# Patient Record
Sex: Female | Born: 1985 | Race: White | Hispanic: No | Marital: Married | Smoking: Current some day smoker
Health system: Southern US, Community
[De-identification: ages and names within clinical notes are randomized; demographics above are authoritative.]

## PROBLEM LIST (undated history)

## (undated) DIAGNOSIS — S83519A Sprain of anterior cruciate ligament of unspecified knee, initial encounter: Secondary | ICD-10-CM

## (undated) HISTORY — PX: NO PAST SURGERIES: SHX2092

---
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
## 2006-08-30 ENCOUNTER — Emergency Department (HOSPITAL_COMMUNITY): Admission: EM | Admit: 2006-08-30 | Discharge: 2006-08-30 | Payer: Self-pay | Admitting: Emergency Medicine

## 2006-09-03 ENCOUNTER — Emergency Department (HOSPITAL_COMMUNITY): Admission: EM | Admit: 2006-09-03 | Discharge: 2006-09-03 | Payer: Self-pay | Admitting: Emergency Medicine

## 2007-03-22 ENCOUNTER — Emergency Department: Payer: Self-pay | Admitting: Emergency Medicine

## 2008-04-08 IMAGING — US US ABDOMEN COMPLETE
2 series · 14 of 25 positions shown · non-contrast
Comparison: Abdominal CT 08/30/06.

CLINICAL DATA: Right sided abdominal pain. 
 ABDOMEN ULTRASOUND:
TECHNIQUE: Complete abdominal ultrasound examination was performed including evaluation of the liver, gallbladder, bile ducts, pancreas, kidneys, spleen, IVC, and abdominal aorta.

[Series 1: unknown · 0.35mm/px · 13 of 58 slices shown (1 of 2)]
[im 1/58]
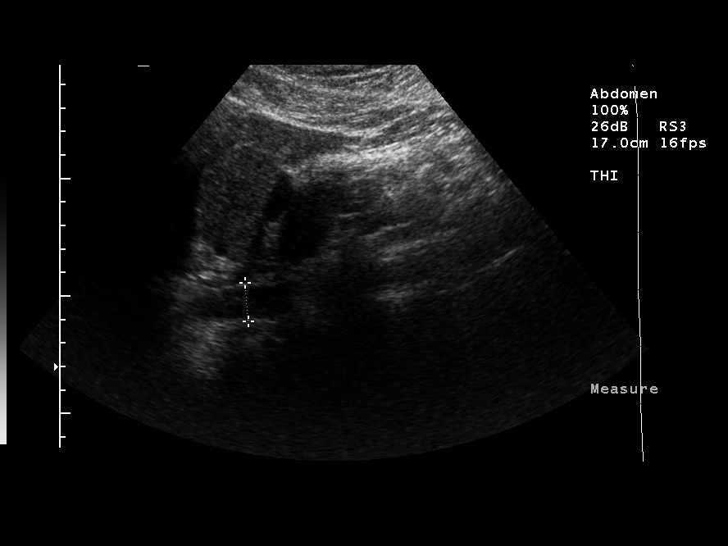
[im 5/58]
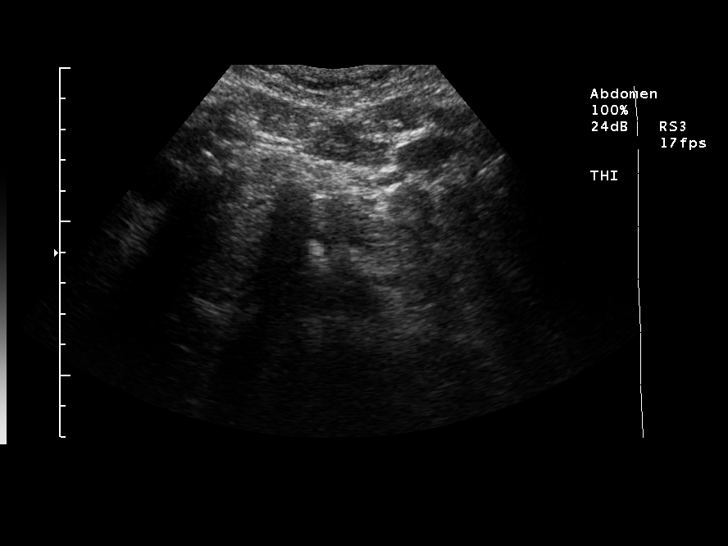
[im 10/58]
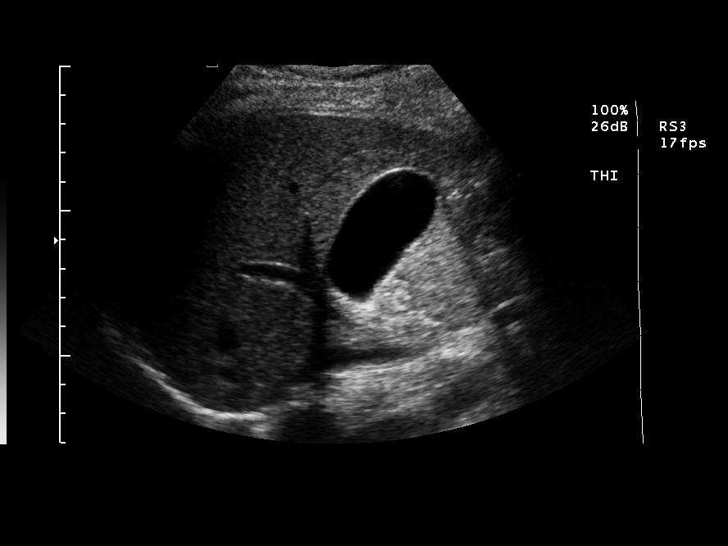
[im 15/58]
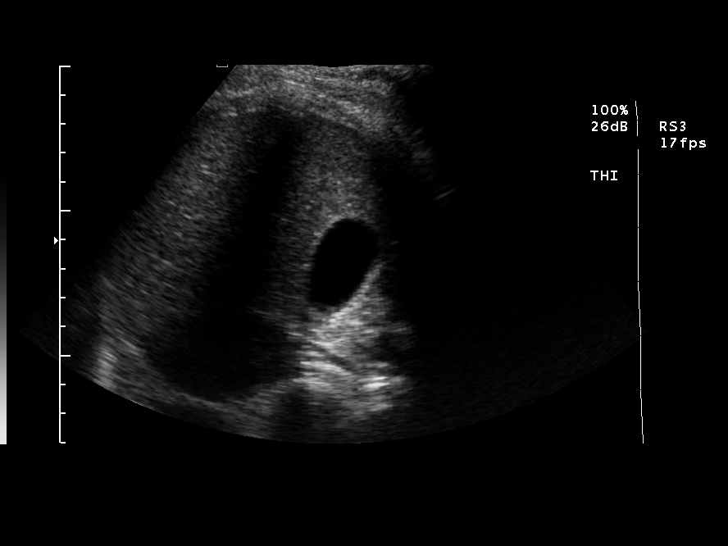
[im 20/58]
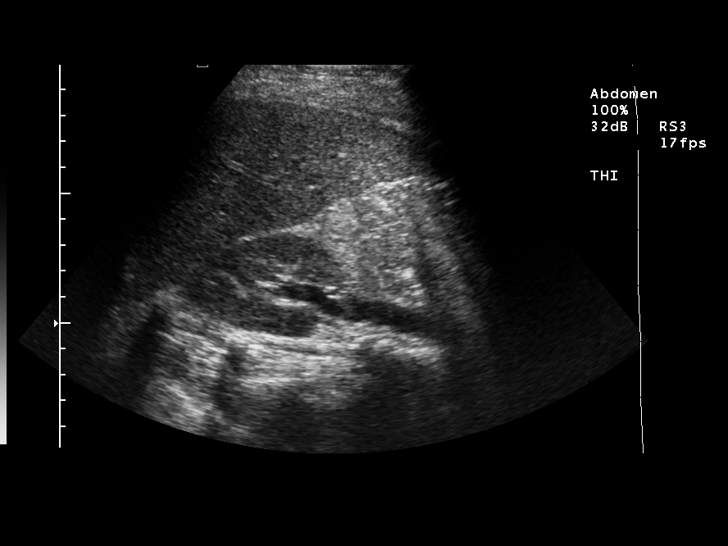
[im 23/58]
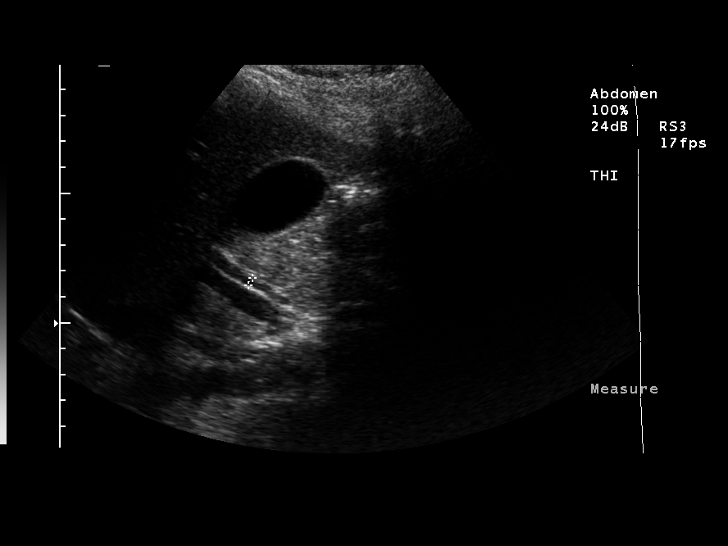
[im 28/58]
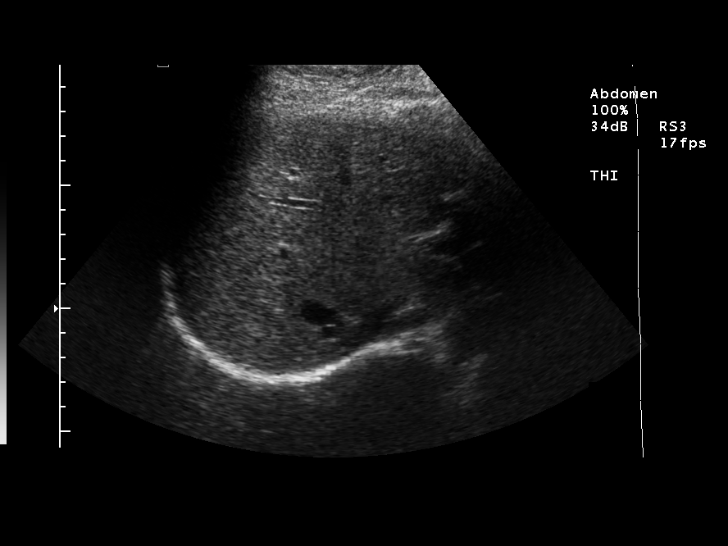
[im 33/58]
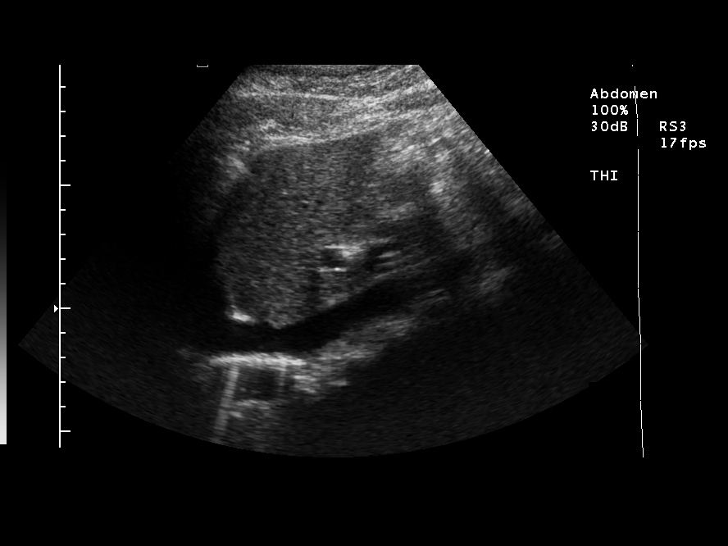
[im 38/58]
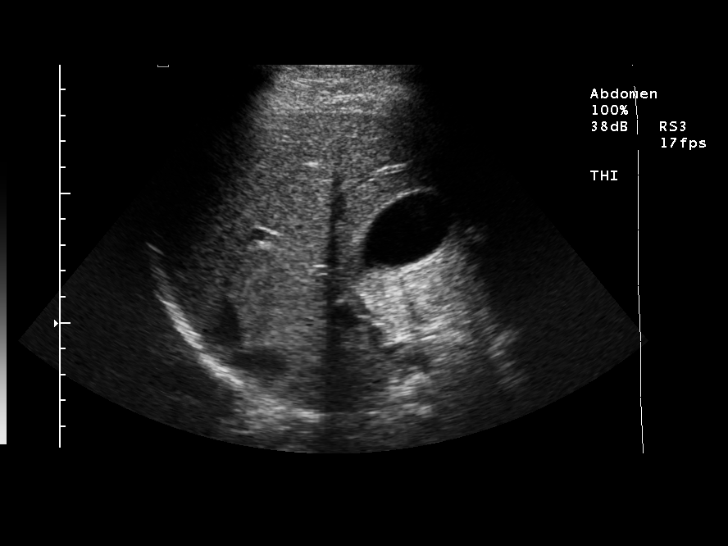
[im 40/58]
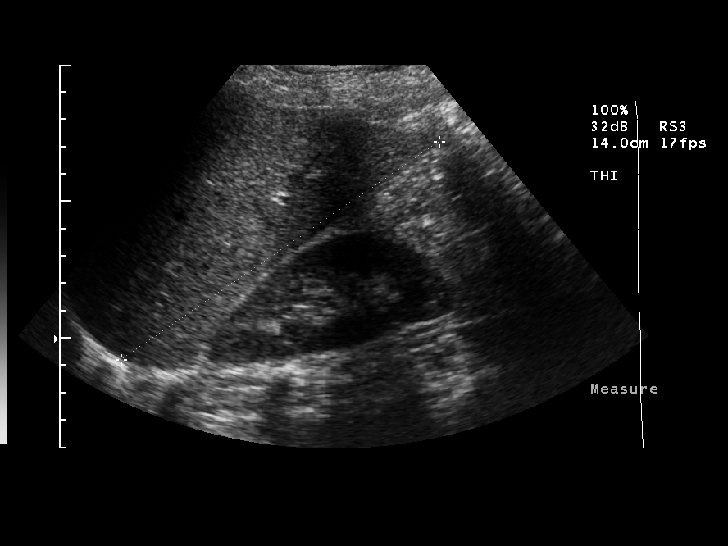
[im 45/58]
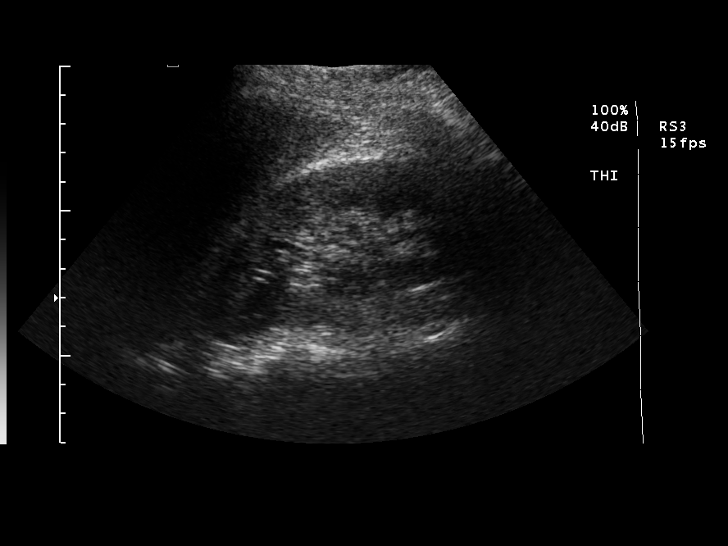
[im 50/58]
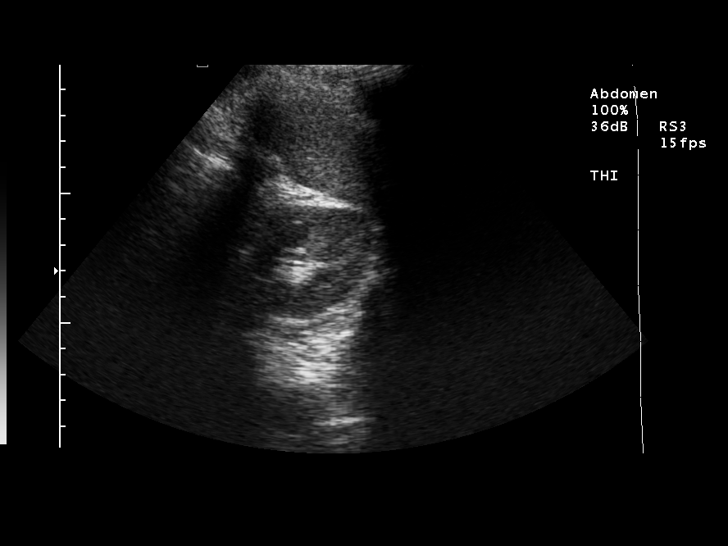
[im 55/58]
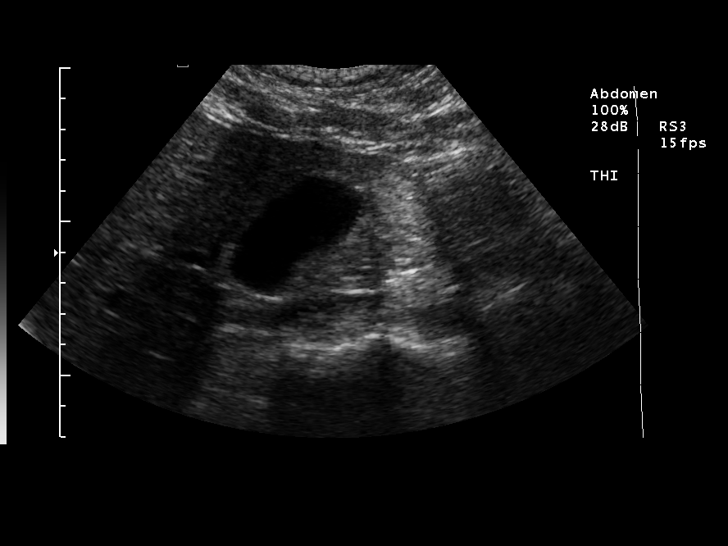

[Series 1: unknown · 0.30mm/px · 1 of 2 slices shown (2 of 2)]
[im 1/2]
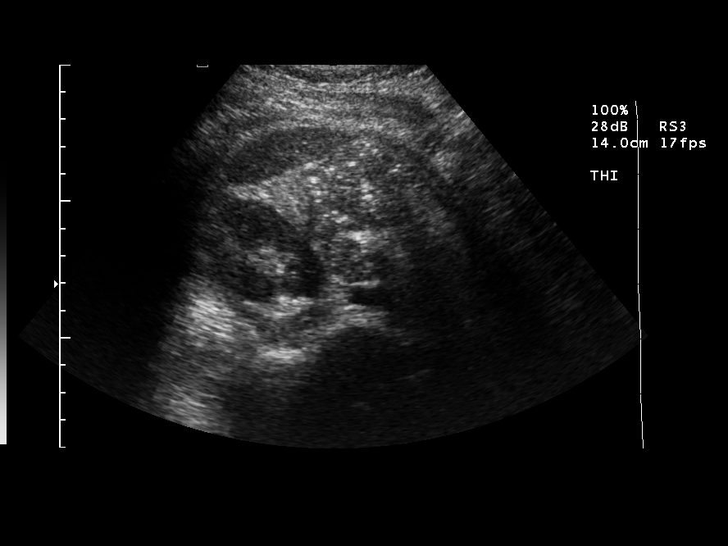

[14 of 25 positions shown; findings below may reference images not displayed]

FINDINGS: There is no evidence of gallstones or biliary ductal dilatation.  The liver is within normal limits in echogenicity, and no focal liver lesions are seen.  The visualized portions of the IVC and pancreas are unremarkable.
 There is no evidence of splenomegaly.  The kidneys are unremarkable, and there is no evidence of hydronephrosis.  Right renal length is 10.2 cm and left renal length 10.6 cm.  The abdominal aorta is non-dilated.
IMPRESSION: Negative abdominal ultrasound.

## 2010-08-03 ENCOUNTER — Emergency Department (HOSPITAL_COMMUNITY)
Admission: EM | Admit: 2010-08-03 | Discharge: 2010-08-03 | Payer: Self-pay | Source: Home / Self Care | Admitting: Emergency Medicine

## 2011-01-21 NOTE — Consult Note (Signed)
Caroline Walker                ACCOUNT NO.:  [id number]   MEDICAL RECORD NO.:  [id number]          PATIENT TYPE:  EMS   LOCATION:  [facility]                         FACILITY:  [facility]   PHYSICIAN:  Lonia Blood, M.D.DATE OF BIRTH:  Jul 28, 1986   DATE OF CONSULTATION:  DATE OF DISCHARGE:                                 CONSULTATION   ER VISIT NOTE:   PRIMARY CARE PHYSICIAN:  Unassigned.   CHIEF COMPLAINT:  Abdominal pain radiating to the right shoulder.   HISTORY OF PRESENT ILLNESS:  [name] is an otherwise healthy 53-  year-old female who was visiting with a friend this morning when she  began to develop severe bilateral upper quadrant abdominal pain of a  sharp, stabbing nature.  The pain has been constant since its onset.  It  has radiated up into the right shoulder region and also down into  bilateral lower abdominal quadrants.  There has been some mild  constipation reported but no diarrhea, no melena, and no hematochezia.  The patient has not had similar symptoms in the past.  There has been no  nausea or vomiting, although the patient does not have an appetite this  morning.  There has been no fevers or chills.  The patient has no other  complaints.  The patient was in her usual state of health yesterday.  There has been no abdominal trauma.  The patient has just completed her  period with her last day of bleeding approximately two days ago.  It was  of normal length.  She does report severe abdominal cramping with her  bleeding and a usual six days of heavy bleeding but states that this  period was not unusual for her.  She has not had dysuria or polyuria.  There have been no new recent sexual encounters.   REVIEW OF SYSTEMS:  Comprehensive review of systems is unremarkable with  the exception of multiple positive elements noted in the history of  present illness above.   PAST MEDICAL HISTORY:  None.   MEDICATIONS:  None.   ALLERGIES:  No known drug  allergies.   FAMILY HISTORY:  Patient's mother is alive and healthy.  The patient's  father's medical history is not known.  There is no extended family  history of cervical cancer, ovarian cancer, breast cancer, or colon  cancer, per the family's report.   SOCIAL HISTORY:  Patient lives in [place].  She does smoke.  She  occasionally partakes of alcohol but never in excess.  She is presently  in school at the college level.  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   DATA REVIEWED:  White count is mildly elevated at 10.6 with a normal  hemoglobin and platelet count.  Sodium, potassium, chloride,  bicarb,  BUN, creatinine, calcium, serum glucose are all normal.  LFTs are  normal.  Total bili is normal.  Albumin is normal at 3.6.   KUB reveals evidence of mild constipation, but there is no acute  abnormality appreciated.   Lipase is normal.  Urinalysis reveals moderate leukocyte esterase and  21-  50 white blood cells.  Urine pregnancy test is negative.   CT scan of the abdomen is obtained, which reveals incomplete rotation of  the bowel with most of the small bowel in the right abdomen and most of  the colon on the left.  There are no signs of vascular compromise or  bowel obstruction.  There are no acute abdominal or pelvic findings, and  the appendix is noted to be normal.   PHYSICAL EXAMINATION:  VITAL SIGNS:  Temperature 98.7, blood pressure  117/71, heart rate 66, respiratory rate 18, O2 sat 99% on room air.  GENERAL:  A well-developed and well-nourished 25 year old female in no  acute respiratory distress.  HEENT:  Normocephalic and atraumatic.  Pupils are equal, round and  reactive to light and accommodation.  Extraocular muscles are intact  bilaterally.  OC/OP clear.  NECK:  No JVD, no lymphadenopathy.  LUNGS:  Clear to auscultation bilaterally without wheezes or rhonchi.  CARDIOVASCULAR:  Regular rate and rhythm without murmur, rub or gallop  with normal S1 and  S2.  ABDOMEN:  Thin, soft, nondistended.  Bowel sounds are positive.  The  patient is tender to deep palpation throughout but much more so in the  right lower quadrant and the right upper quadrant.  There is no rebound.  There is no appreciable mass.  There is no evident ascites.  EXTREMITIES:  No significant clubbing, cyanosis or edema in the  bilateral lower extremities.  NEUROLOGIC:  The neurologic exam is nonfocal.   IMPRESSION/PLAN:  1. Pyelonephritis:  A new diagnosis of pyelonephritis has been made.      There is no evidence of a perinephric abscess.  We will treat the      patient empirically with Cipro 500 mg p.o. b.i.d. x10 days total.      Urine will be sent for culture, and the patient will be contacted      if her urine culture reveals a drug-resistant strain.  2. Severe abdominal pain:  The extent of the patient's abdominal pain      was worrisome.  This prompted a CT scan of the abdomen to rule out      the possibility of appendicitis.  Fortunately, the appendix was      able to be visualized and was noted to be normal on CT scan.      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  While an unexpected      finding, there is no evidence of acute complications related to      this.  I have informed the patient of this finding.  I have      explained to her that this may cause frequent constipation and have      advised her to use a fibre additive in her diet.  I have advised      her that if the pain is related to her pyelonephritis, it should      improve significantly within the next 24-48 hours.  I have advised      her that should her pain continue or should it worsen, she should      report immediately back to the emergency room for re-evaluation.      At this time, I am confident ruling out pancreatitis, appendicitis,      and cholecystitis.  Should the patient's pain not improve, we may      need to consider the possibility of a full GYN evaluation. 3. Tobacco abuse:  Patient was advised that she should discontinue      smoking completely immediately.  She was advised of the multiple      deleterious effects of ongoing tobacco abuse.      Lonia Blood, M.D.  Electronically Signed     JTM/MEDQ  D:  08/30/2006  T:  08/30/2006  Job:  [id number]

## 2014-12-15 ENCOUNTER — Encounter (HOSPITAL_COMMUNITY): Payer: Self-pay

## 2014-12-15 ENCOUNTER — Emergency Department (HOSPITAL_COMMUNITY)
Admission: EM | Admit: 2014-12-15 | Discharge: 2014-12-15 | Disposition: A | Discharge status: Home / Self Care | Payer: Self-pay | Attending: Emergency Medicine | Admitting: Emergency Medicine

## 2014-12-15 DIAGNOSIS — H6091 Unspecified otitis externa, right ear: Secondary | ICD-10-CM | POA: Insufficient documentation

## 2014-12-15 DIAGNOSIS — Z720 Tobacco use: Secondary | ICD-10-CM | POA: Insufficient documentation

## 2014-12-15 DIAGNOSIS — J029 Acute pharyngitis, unspecified: Secondary | ICD-10-CM | POA: Insufficient documentation

## 2014-12-15 LAB — RAPID STREP SCREEN (MED CTR MEBANE ONLY): Streptococcus, Group A Screen (Direct): NEGATIVE

## 2014-12-15 MED ORDER — NEOMYCIN-POLYMYXIN-HC 1 % OT SOLN
4.0000 [drp] | OTIC | Status: AC
  Administered 2014-12-15: 4 [drp] via OTIC
  Filled 2014-12-15: qty 10

## 2014-12-15 MED ORDER — HYDROCODONE-ACETAMINOPHEN 5-325 MG PO TABS
2.0000 | ORAL_TABLET | Freq: Once | ORAL | Status: AC
  Administered 2014-12-15: 2 via ORAL
  Filled 2014-12-15: qty 2

## 2014-12-15 MED ORDER — HYDROCODONE-ACETAMINOPHEN 5-325 MG PO TABS
2.0000 | ORAL_TABLET | ORAL | Status: DC | PRN
Start: 2014-12-15 — End: 2021-09-12

## 2014-12-15 MED ORDER — NAPROXEN 500 MG PO TABS: 500.0000 mg | ORAL_TABLET | Freq: Two times a day (BID) | ORAL | Status: DC

## 2014-12-15 MED ORDER — ANTIPYRINE-BENZOCAINE 5.4-1.4 % OT SOLN: 3.0000 [drp] | OTIC | Status: DC | PRN

## 2014-12-15 NOTE — ED Notes (Signed)
Pt states ear pain has been going on for the past 3-4 four hours, denies trauma

## 2014-12-15 NOTE — ED Provider Notes (Signed)
CSN: [id number]     Arrival date & time 12/15/14  0533 History   First MD Initiated Contact with Patient 12/15/14 0546     Chief Complaint  Patient presents with  . Otalgia     (Consider location/radiation/quality/duration/timing/severity/associated sxs/prior Treatment) HPI Comments: The patient is a 29 year old female, she presents with right ear pain which started 3 hours ago, seems to be worse with pulling on the ear, palpation of the ear and is associated with a sore throat. She reports recently being ill. Symptoms are persistent, gradually worsening, not associated with fevers.  The history is provided by the patient.    History reviewed. No pertinent past medical history. History reviewed. No pertinent past surgical history. No family history on file. History  Substance Use Topics  . Smoking status: Current Every Day Smoker  . Smokeless tobacco: Not on file  . Alcohol Use: No   OB History    No data available     Review of Systems  Constitutional: Negative for fever.  HENT: Positive for ear pain and sore throat. Negative for ear discharge.       Allergies  Bee venom and Penicillins  Home Medications   Prior to Admission medications   Medication Sig Start Date End Date Taking? Authorizing Provider  antipyrine-benzocaine Lyla Son(AURALGAN) otic solution Place 3-4 drops into the right ear every 2 (two) hours as needed for ear pain. 12/15/14   Eber HongBrian Shontavia Mickel, MD  HYDROcodone-acetaminophen (NORCO/VICODIN) 5-325 MG per tablet Take 2 tablets by mouth every 4 (four) hours as needed. 12/15/14   Eber HongBrian Bran Aldridge, MD  naproxen (NAPROSYN) 500 MG tablet Take 1 tablet (500 mg total) by mouth 2 (two) times daily with a meal. 12/15/14   Eber HongBrian Starlette Thurow, MD   BP 127/71 mmHg  Pulse 75  Resp 18  Ht 5\' 5"  (1.651 m)  Wt 145 lb (65.772 kg)  BMI 24.13 kg/m2  SpO2 100%  LMP 11/27/2014 Physical Exam  Constitutional: She appears well-developed and well-nourished.  HENT:  Head: Normocephalic and  atraumatic.  Pain with manipulation of the auricle and the tragus on the right, normal appearing left external ear, right external auditory canal with erythema and some desquamation, normal-appearing tympanic membrane, clear effusion behind the membrane, no purulence or erythema, normal-appearing tympanic membrane on the left, oropharynx with tonsillar hypertrophy and mild erythema, no exudate asymmetry or uvular deviation, mucous members moist and phonation normal  Eyes: Conjunctivae are normal. Right eye exhibits no discharge. Left eye exhibits no discharge.  Pulmonary/Chest: Effort normal. No respiratory distress.  Lymphadenopathy:    She has cervical adenopathy (cervical adenopathy present in the left anterior cervical chain, no stiffness to the neck).  Neurological: She is alert. Coordination normal.  Skin: Skin is warm and dry. No rash noted. She is not diaphoretic. No erythema.  Psychiatric: She has a normal mood and affect.  Nursing note and vitals reviewed.   ED Course  Procedures (including critical care time) Labs Review Labs Reviewed  RAPID STREP SCREEN  CULTURE, GROUP A STREP    Imaging Review No results found.   EKG Interpretation None      MDM   Final diagnoses:  Otitis externa, right  Pharyngitis    Likely has otitis externa, possibly strep pharyngitis, vital signs normal, patient nontoxic  Strep neg  Meds given in ED:  Medications  NEOMYCIN-POLYMYXIN-HYDROCORTISONE (CORTISPORIN) otic solution 4 drop (not administered)  HYDROcodone-acetaminophen (NORCO/VICODIN) 5-325 MG per tablet 2 tablet (2 tablets Oral Given 12/15/14 [id number])    New  Prescriptions   ANTIPYRINE-BENZOCAINE (AURALGAN) OTIC SOLUTION    Place 3-4 drops into the right ear every 2 (two) hours as needed for ear pain.   HYDROCODONE-ACETAMINOPHEN (NORCO/VICODIN) 5-325 MG PER TABLET    Take 2 tablets by mouth every 4 (four) hours as needed.   NAPROXEN (NAPROSYN) 500 MG TABLET    Take 1 tablet (500  mg total) by mouth 2 (two) times daily with a meal.        Eber Hong, MD 12/15/14 [phone]

## 2014-12-15 NOTE — Discharge Instructions (Signed)
Please call your doctor for a followup appointment within 24-48 hours. When you talk to your doctor please let them know that you were seen in the emergency department and have them acquire all of your records so that they can discuss the findings with you and formulate a treatment plan to fully care for your new and ongoing problems.   Use the ear drops - 4 drops every 4 hours when awake - this should improve over next 2 days - hydrocodone and naprosyn for severe pain

## 2014-12-17 LAB — CULTURE, GROUP A STREP

## 2018-03-15 ENCOUNTER — Ambulatory Visit: Payer: Self-pay | Admitting: Family Medicine

## 2018-03-15 VITALS — BP 110/60 | HR 71 | Resp 18 | Wt 149.4 lb

## 2018-03-15 DIAGNOSIS — L089 Local infection of the skin and subcutaneous tissue, unspecified: Secondary | ICD-10-CM

## 2018-03-15 MED ORDER — DOXYCYCLINE HYCLATE 100 MG PO CAPS: 100.0000 mg | ORAL_CAPSULE | Freq: Two times a day (BID) | ORAL | 0 refills | Status: DC

## 2018-03-15 NOTE — Patient Instructions (Addendum)
Take Doxycyline as prescribed. I have attached information which provides you with signs and symptoms indicating worsening infection. Continue to apply antibiotic ointment to wound.   Tdap Vaccine (Tetanus, Diphtheria and Pertussis): What You Need to Know 1. Why get vaccinated? Tetanus, diphtheria and pertussis are very serious diseases. Tdap vaccine can protect [place] from these diseases. And, Tdap vaccine given to pregnant women can protect newborn babies against pertussis. TETANUS (Lockjaw) is rare in the [place] today. It causes painful muscle tightening and stiffness, usually all over the body.  It can lead to tightening of muscles in the head and neck so you can't open your mouth, swallow, or sometimes even breathe. Tetanus kills about 1 out of 10 people who are infected even after receiving the best medical care.  DIPHTHERIA is also rare in the [place] today. It can cause a thick coating to form in the back of the throat.  It can lead to breathing problems, heart failure, paralysis, and death.  PERTUSSIS (Whooping Cough) causes severe coughing spells, which can cause difficulty breathing, vomiting and disturbed sleep.  It can also lead to weight loss, incontinence, and rib fractures. Up to 2 in 100 adolescents and 5 in 100 adults with pertussis are hospitalized or have complications, which could include pneumonia or death.  These diseases are caused by bacteria. Diphtheria and pertussis are spread from person to person through secretions from coughing or sneezing. Tetanus enters the body through cuts, scratches, or wounds. Before vaccines, as many as 200,000 cases of diphtheria, 200,000 cases of pertussis, and hundreds of cases of tetanus, were reported in the [place] each year. Since vaccination began, reports of cases for tetanus and diphtheria have dropped by about 99% and for pertussis by about 80%. 2. Tdap vaccine Tdap vaccine can protect adolescents and adults from  tetanus, diphtheria, and pertussis. One dose of Tdap is routinely given at age 70 or 51. People who did not get Tdap at that age should get it as soon as possible. Tdap is especially important for healthcare professionals and anyone having close contact with a baby younger than 12 months. Pregnant women should get a dose of Tdap during every pregnancy, to protect the newborn from pertussis. Infants are most at risk for severe, life-threatening complications from pertussis. Another vaccine, called Td, protects against tetanus and diphtheria, but not pertussis. A Td booster should be given every 10 years. Tdap may be given as one of these boosters if you have never gotten Tdap before. Tdap may also be given after a severe cut or burn to prevent tetanus infection. Your doctor or the person giving you the vaccine can give you more information. Tdap may safely be given at the same time as other vaccines. 3. Some people should not get this vaccine  A person who has ever had a life-threatening allergic reaction after a previous dose of any diphtheria, tetanus or pertussis containing vaccine, OR has a severe allergy to any part of this vaccine, should not get Tdap vaccine. Tell the person giving the vaccine about any severe allergies.  Anyone who had coma or long repeated seizures within 7 days after a childhood dose of DTP or DTaP, or a previous dose of Tdap, should not get Tdap, unless a cause other than the vaccine was found. They can still get Td.  Talk to your doctor if you: ? have seizures or another nervous system problem, ? had severe pain or swelling after any vaccine containing diphtheria, tetanus or  pertussis, ? ever had a condition called Guillain-Barr Syndrome (GBS), ? aren't feeling well on the day the shot is scheduled. 4. Risks With any medicine, including vaccines, there is a chance of side effects. These are usually mild and go away on their own. Serious reactions are also possible but  are rare. Most people who get Tdap vaccine do not have any problems with it. Mild problems following Tdap: (Did not interfere with activities)  Pain where the shot was given (about 3 in 4 adolescents or 2 in 3 adults)  Redness or swelling where the shot was given (about 1 person in 5)  Mild fever of at least 100.4F (up to about 1 in 25 adolescents or 1 in 100 adults)  Headache (about 3 or 4 people in 10)  Tiredness (about 1 person in 3 or 4)  Nausea, vomiting, diarrhea, stomach ache (up to 1 in 4 adolescents or 1 in 10 adults)  Chills, sore joints (about 1 person in 10)  Body aches (about 1 person in 3 or 4)  Rash, swollen glands (uncommon)  Moderate problems following Tdap: (Interfered with activities, but did not require medical attention)  Pain where the shot was given (up to 1 in 5 or 6)  Redness or swelling where the shot was given (up to about 1 in 16 adolescents or 1 in 12 adults)  Fever over 102F (about 1 in 100 adolescents or 1 in 250 adults)  Headache (about 1 in 7 adolescents or 1 in 10 adults)  Nausea, vomiting, diarrhea, stomach ache (up to 1 or 3 people in 100)  Swelling of the entire arm where the shot was given (up to about 1 in 500).  Severe problems following Tdap: (Unable to perform usual activities; required medical attention)  Swelling, severe pain, bleeding and redness in the arm where the shot was given (rare).  Problems that could happen after any vaccine:  People sometimes faint after a medical procedure, including vaccination. Sitting or lying down for about 15 minutes can help prevent fainting, and injuries caused by a fall. Tell your doctor if you feel dizzy, or have vision changes or ringing in the ears.  Some people get severe pain in the shoulder and have difficulty moving the arm where a shot was given. This happens very rarely.  Any medication can cause a severe allergic reaction. Such reactions from a vaccine are very rare,  estimated at fewer than 1 in a million doses, and would happen within a few minutes to a few hours after the vaccination. As with any medicine, there is a very remote chance of a vaccine causing a serious injury or death. The safety of vaccines is always being monitored. For more information, visit: http://floyd.org/www.cdc.gov/vaccinesafety/ 5. What if there is a serious problem? What should I look for? Look for anything that concerns you, such as signs of a severe allergic reaction, very high fever, or unusual behavior. Signs of a severe allergic reaction can include hives, swelling of the face and throat, difficulty breathing, a fast heartbeat, dizziness, and weakness. These would usually start a few minutes to a few hours after the vaccination. What should I do?  If you think it is a severe allergic reaction or other emergency that can't wait, call 9-1-1 or get the person to the nearest hospital. Otherwise, call your doctor.  Afterward, the reaction should be reported to the Vaccine Adverse Event Reporting System (VAERS). Your doctor might file this report, or you can do it yourself  through the VAERS web site at www.vaers.LAgents.no, or by calling 1-2344654208. ? VAERS does not give medical advice. 6. The National Vaccine Injury Compensation Program The [employer] Vaccine Injury Compensation Program (VICP) is a federal program that was created to compensate people who may have been injured by certain vaccines. Persons who believe they may have been injured by a vaccine can learn about the program and about filing a claim by calling 1-208-002-6431 or visiting the VICP website at SpiritualWord.at. There is a time limit to file a claim for compensation. 7. How can I learn more?  Ask your doctor. He or she can give you the vaccine package insert or suggest other sources of information.  Call your local or state health department.  Contact the Centers for Disease Control and Prevention  (CDC): ? Call 219-061-8280 (1-800-CDC-INFO) or ? Visit CDC's website at PicCapture.uy CDC Tdap Vaccine VIS (10/29/13) This information is not intended to replace advice given to you by your health care provider. Make sure you discuss any questions you have with your health care provider. Document Released: 02/21/2012 Document Revised: 05/12/2016 Document Reviewed: 05/12/2016 Elsevier Interactive Patient Education  2017 [employer] Inc.  Cellulitis, Adult Cellulitis is a skin infection. The infected area is usually red and sore. This condition occurs most often in the arms and lower legs. It is very important to get treated for this condition. Follow these instructions at home:  Take over-the-counter and prescription medicines only as told by your doctor.  If you were prescribed an antibiotic medicine, take it as told by your doctor. Do not stop taking the antibiotic even if you start to feel better.  Drink enough fluid to keep your pee (urine) clear or pale yellow.  Do not touch or rub the infected area.  Raise (elevate) the infected area above the level of your heart while you are sitting or lying down.  Place warm or cold wet cloths (warm or cold compresses) on the infected area. Do this as told by your doctor.  Keep all follow-up visits as told by your doctor. This is important. These visits let your doctor make sure your infection is not getting worse. Contact a doctor if:  You have a fever.  Your symptoms do not get better after 1-2 days of treatment.  Your bone or joint under the infected area starts to hurt after the skin has healed.  Your infection comes back. This can happen in the same area or another area.  You have a swollen bump in the infected area.  You have new symptoms.  You feel ill and also have muscle aches and pains. Get help right away if:  Your symptoms get worse.  You feel very sleepy.  You throw up (vomit) or have watery poop (diarrhea) for  a long time.  There are red streaks coming from the infected area.  Your red area gets larger.  Your red area turns darker. This information is not intended to replace advice given to you by your health care provider. Make sure you discuss any questions you have with your health care provider. Document Released: 02/08/2008 Document Revised: 01/28/2016 Document Reviewed: 07/01/2015 Elsevier Interactive Patient Education  2018 [employer].

## 2018-03-15 NOTE — Progress Notes (Signed)
Patient ID: Caroline Walker, female    DOB: 06/26/1986, 32 y.o.   MRN: [id number]  PCP: Patient, No Pcp Per  Chief Complaint  Patient presents with  . wound/IMM    Subjective:  HPI Caroline Walker is a 32 y.o. female presents for evaluation of wound infection. Patient reports running over 1 week ago and she tripped falling onto a piece of wound stump lying the yard. A piece of the wood from the stump entered the patient's leg. She removed the wood splinter and has applied peroxide, topical antibiotic, and tea tree oil. The wound remains painful and tender to touch. She denies red streaks , fever, chills, or swelling of the surrounding skin. Social History   Socioeconomic History  . Marital status: Married    Spouse name: Not on file  . Number of children: Not on file  . Years of education: Not on file  . Highest education level: Not on file  Occupational History  . Not on file  Social Needs  . Financial resource strain: Not on file  . Food insecurity:    Worry: Not on file    Inability: Not on file  . Transportation needs:    Medical: Not on file    Non-medical: Not on file  Tobacco Use  . Smoking status: Current Every Day Smoker  Substance and Sexual Activity  . Alcohol use: No  . Drug use: Not on file  . Sexual activity: Not on file  Lifestyle  . Physical activity:    Days per week: Not on file    Minutes per session: Not on file  . Stress: Not on file  Relationships  . Social connections:    Talks on phone: Not on file    Gets together: Not on file    Attends religious service: Not on file    Active member of club or organization: Not on file    Attends meetings of clubs or organizations: Not on file    Relationship status: Not on file  . Intimate partner violence:    Fear of current or ex partner: Not on file    Emotionally abused: Not on file    Physically abused: Not on file    Forced sexual activity: Not on file  Other Topics Concern  . Not on file  Social  History Narrative  . Not on file    Review of Systems  Pertinent negatives listed in HPI  There are no active problems to display for this patient.   Allergies  Allergen Reactions  . Bee Venom Hives  . Penicillins Hives    Prior to Admission medications   Medication Sig Start Date End Date Taking? Authorizing Provider  antipyrine-benzocaine Lyla Son(AURALGAN) otic solution Place 3-4 drops into the right ear every 2 (two) hours as needed for ear pain. 12/15/14   Eber HongMiller, Brian, MD  HYDROcodone-acetaminophen (NORCO/VICODIN) 5-325 MG per tablet Take 2 tablets by mouth every 4 (four) hours as needed. 12/15/14   Eber HongMiller, Brian, MD  naproxen (NAPROSYN) 500 MG tablet Take 1 tablet (500 mg total) by mouth 2 (two) times daily with a meal. 12/15/14   Eber HongMiller, Brian, MD    Past Medical, Surgical Family and Social History reviewed and updated.    Objective:   Today's Vitals   03/15/18 1453  BP: 110/60  Pulse: 71  Resp: 18  Weight: 149 lb 6.4 oz (67.8 kg)    Wt Readings from Last 3 Encounters:  12/15/14 145 lb (65.8 kg)  Physical Exam  Constitutional: She appears well-developed and well-nourished.  HENT:  Head: Normocephalic.  Cardiovascular: Normal rate, regular rhythm and normal heart sounds.  Pulmonary/Chest: Effort normal and breath sounds normal.  Neurological: She is alert.  Skin: Lesion noted. There is erythema.     Psychiatric: She has a normal mood and affect. Her behavior is normal. Judgment and thought content normal.   Assessment & Plan:  1. Skin infection,  Covering against possible early cellulitis with a 10 day course of Doxycyline.  Encouraged to continue topical antibiotic therapy. TDAP given.  Meds ordered this encounter  Medications  . doxycycline (VIBRAMYCIN) 100 MG capsule    Sig: Take 1 capsule (100 mg total) by mouth 2 (two) times daily.    Dispense:  20 capsule    Refill:  0   If symptoms worsen or do not improve, return for follow-up, follow-up with  PCP, or at the emergency department if severity of symptoms warrant a higher level of care.    Godfrey Pick. Tiburcio Pea, MSN, FNP-C [facility]  [address]  [place], [place] [address] [phone]

## 2018-03-15 NOTE — Progress Notes (Signed)
Pt presents here today for visit to receive Tdap vaccine. Allergies reviewed, vaccine given, vaccine information statement provided, tolerated well.   

## 2019-06-26 ENCOUNTER — Other Ambulatory Visit: Payer: Self-pay

## 2019-06-26 DIAGNOSIS — Z20822 Contact with and (suspected) exposure to covid-19: Secondary | ICD-10-CM

## 2019-06-27 LAB — NOVEL CORONAVIRUS, NAA: SARS-CoV-2, NAA: NOT DETECTED

## 2019-07-26 ENCOUNTER — Other Ambulatory Visit: Payer: Self-pay

## 2019-07-26 DIAGNOSIS — Z20822 Contact with and (suspected) exposure to covid-19: Secondary | ICD-10-CM

## 2019-07-29 LAB — NOVEL CORONAVIRUS, NAA: SARS-CoV-2, NAA: NOT DETECTED

## 2019-07-30 ENCOUNTER — Telehealth: Payer: Self-pay

## 2019-07-30 NOTE — Telephone Encounter (Signed)
Patient given negative result and verbalized understanding  

## 2019-09-09 ENCOUNTER — Ambulatory Visit: Payer: Self-pay | Attending: Internal Medicine

## 2019-09-09 DIAGNOSIS — Z20822 Contact with and (suspected) exposure to covid-19: Secondary | ICD-10-CM | POA: Insufficient documentation

## 2019-09-10 ENCOUNTER — Ambulatory Visit: Payer: Self-pay | Attending: Internal Medicine

## 2019-09-10 LAB — NOVEL CORONAVIRUS, NAA: SARS-CoV-2, NAA: NOT DETECTED

## 2021-06-26 ENCOUNTER — Emergency Department (HOSPITAL_BASED_OUTPATIENT_CLINIC_OR_DEPARTMENT_OTHER): Payer: Self-pay

## 2021-06-26 ENCOUNTER — Encounter (HOSPITAL_BASED_OUTPATIENT_CLINIC_OR_DEPARTMENT_OTHER): Payer: Self-pay | Admitting: Emergency Medicine

## 2021-06-26 ENCOUNTER — Emergency Department (HOSPITAL_BASED_OUTPATIENT_CLINIC_OR_DEPARTMENT_OTHER)
Admission: EM | Admit: 2021-06-26 | Discharge: 2021-06-26 | Disposition: A | Discharge status: Home / Self Care | Payer: Self-pay | Attending: Emergency Medicine | Admitting: Emergency Medicine

## 2021-06-26 ENCOUNTER — Other Ambulatory Visit: Payer: Self-pay

## 2021-06-26 DIAGNOSIS — F17210 Nicotine dependence, cigarettes, uncomplicated: Secondary | ICD-10-CM | POA: Insufficient documentation

## 2021-06-26 DIAGNOSIS — R1032 Left lower quadrant pain: Secondary | ICD-10-CM | POA: Insufficient documentation

## 2021-06-26 LAB — URINALYSIS, ROUTINE W REFLEX MICROSCOPIC
Bilirubin Urine: NEGATIVE
Glucose, UA: NEGATIVE mg/dL
Hgb urine dipstick: NEGATIVE
Ketones, ur: NEGATIVE mg/dL
Leukocytes,Ua: NEGATIVE
Nitrite: NEGATIVE
Specific Gravity, Urine: 1.023 (ref 1.005–1.030)
pH: 6.5 (ref 5.0–8.0)

## 2021-06-26 LAB — COMPREHENSIVE METABOLIC PANEL
ALT: 25 U/L (ref 0–44)
AST: 22 U/L (ref 15–41)
Albumin: 4.4 g/dL (ref 3.5–5.0)
Alkaline Phosphatase: 72 U/L (ref 38–126)
Anion gap: 8 (ref 5–15)
BUN: 11 mg/dL (ref 6–20)
CO2: 25 mmol/L (ref 22–32)
Calcium: 9.3 mg/dL (ref 8.9–10.3)
Chloride: 103 mmol/L (ref 98–111)
Creatinine, Ser: 0.72 mg/dL (ref 0.44–1.00)
GFR, Estimated: >60 mL/min (ref >60)
Glucose, Bld: 94 mg/dL (ref 70–99)
Potassium: 4 mmol/L (ref 3.5–5.1)
Sodium: 136 mmol/L (ref 135–145)
Total Bilirubin: 0.9 mg/dL (ref 0.3–1.2)
Total Protein: 7.3 g/dL (ref 6.5–8.1)

## 2021-06-26 LAB — WET PREP, GENITAL
Sperm: NONE SEEN
Trich, Wet Prep: NONE SEEN
Yeast Wet Prep HPF POC: NONE SEEN

## 2021-06-26 LAB — CBC
HCT: 41.7 % (ref 36.0–46.0)
Hemoglobin: 13.9 g/dL (ref 12.0–15.0)
MCH: 29.7 pg (ref 26.0–34.0)
MCHC: 33.3 g/dL (ref 30.0–36.0)
MCV: 89.1 fL (ref 80.0–100.0)
Platelets: 316 10*3/uL (ref 150–400)
RBC: 4.68 MIL/uL (ref 3.87–5.11)
RDW: 13.1 % (ref 11.5–15.5)
WBC: 11.7 10*3/uL — ABNORMAL HIGH (ref 4.0–10.5)
nRBC: 0 % (ref 0.0–0.2)

## 2021-06-26 LAB — PREGNANCY, URINE: Preg Test, Ur: NEGATIVE

## 2021-06-26 LAB — LIPASE, BLOOD: Lipase: 42 U/L (ref 11–51)

## 2021-06-26 MED ORDER — ACETAMINOPHEN 325 MG PO TABS
650.0000 mg | ORAL_TABLET | Freq: Once | ORAL | Status: AC
  Administered 2021-06-26: 650 mg via ORAL
  Filled 2021-06-26: qty 2

## 2021-06-26 MED ORDER — IBUPROFEN 400 MG PO TABS
600.0000 mg | ORAL_TABLET | Freq: Once | ORAL | Status: AC
  Administered 2021-06-26: 600 mg via ORAL
  Filled 2021-06-26: qty 1

## 2021-06-26 NOTE — Discharge Instructions (Signed)
Call your primary care doctor or specialist as discussed in the next 2-3 days.   Return immediately back to the ER if:  Your symptoms worsen within the next 12-24 hours. You develop new symptoms such as new fevers, persistent vomiting, new pain, shortness of breath, or new weakness or numbness, or if you have any other concerns.  

## 2021-06-26 NOTE — ED Notes (Signed)
Patient transported to ultrasound.

## 2021-06-26 NOTE — ED Provider Notes (Addendum)
[facility] EMERGENCY DEPT Provider Note   CSN: [id number] Arrival date & time: 06/26/21  1137     History Chief Complaint  Patient presents with   Abdominal Pain    Caroline Walker is a 35 y.o. female.  Patient presents with sharp left lower quadrant pain.  She states it was intermittent last night and today has been more persistent sharp pain in the left lower quadrant otherwise nonradiating.  No associated fevers or cough no vomiting or diarrhea no vaginal discharge or vaginal bleeding reported.  She states she sexually active with 1 partner.      History reviewed. No pertinent past medical history.  There are no problems to display for this patient.   History reviewed. No pertinent surgical history.   OB History   No obstetric history on file.     No family history on file.  Social History   Tobacco Use   Smoking status: Every Day   Smokeless tobacco: Never  Substance Use Topics   Alcohol use: Yes   Drug use: Yes    Types: Marijuana, Cocaine    Home Medications Prior to Admission medications   Medication Sig Start Date End Date Taking? Authorizing Provider  antipyrine-benzocaine Lyla Son) otic solution Place 3-4 drops into the right ear every 2 (two) hours as needed for ear pain. Patient not taking: Reported on 03/15/2018 12/15/14   Eber Elvyn Krohn, MD  doxycycline (VIBRAMYCIN) 100 MG capsule Take 1 capsule (100 mg total) by mouth 2 (two) times daily. 03/15/18   Bing Neighbors, FNP  HYDROcodone-acetaminophen (NORCO/VICODIN) 5-325 MG per tablet Take 2 tablets by mouth every 4 (four) hours as needed. Patient not taking: Reported on 03/15/2018 12/15/14   Eber Peter Keyworth, MD  naproxen (NAPROSYN) 500 MG tablet Take 1 tablet (500 mg total) by mouth 2 (two) times daily with a meal. Patient not taking: Reported on 03/15/2018 12/15/14   Eber Jakira Mcfadden, MD    Allergies    Bee venom and Penicillins  Review of Systems   Review of Systems  Constitutional:   Negative for fever.  HENT:  Negative for ear pain.   Eyes:  Negative for pain.  Respiratory:  Negative for cough.   Cardiovascular:  Negative for chest pain.  Gastrointestinal:  Positive for abdominal pain.  Genitourinary:  Negative for flank pain.  Musculoskeletal:  Negative for back pain.  Skin:  Negative for rash.  Neurological:  Negative for headaches.   Physical Exam Updated Vital Signs BP 120/83   Pulse 70   Temp 98.3 F (36.8 C) (Oral)   Resp 18   Ht 5\' 6"  (1.676 m)   Wt 74.8 kg   LMP 06/07/2021   SpO2 100%   BMI 26.63 kg/m   Physical Exam Constitutional:      General: She is not in acute distress.    Appearance: Normal appearance.  HENT:     Head: Normocephalic.     Nose: Nose normal.  Eyes:     Extraocular Movements: Extraocular movements intact.  Cardiovascular:     Rate and Rhythm: Normal rate.  Pulmonary:     Effort: Pulmonary effort is normal.  Abdominal:     Comments: Tenderness in the left lower quadrant.  Positive guarding.  Musculoskeletal:        General: Normal range of motion.     Cervical back: Normal range of motion.  Neurological:     General: No focal deficit present.     Mental Status: She is alert. Mental  status is at baseline.    ED Results / Procedures / Treatments   Labs (all labs ordered are listed, but only abnormal results are displayed) Labs Reviewed  CBC - Abnormal; Notable for the following components:      Result Value   WBC 11.7 (*)    All other components within normal limits  URINALYSIS, ROUTINE W REFLEX MICROSCOPIC - Abnormal; Notable for the following components:   Protein, ur TRACE (*)    All other components within normal limits  LIPASE, BLOOD  COMPREHENSIVE METABOLIC PANEL  PREGNANCY, URINE  GC/CHLAMYDIA PROBE AMP ([place]) NOT AT [facility]    EKG None  Radiology US PELVIC COMPLETE W TRANSVAGINAL AND TORSION R/O  Result Date: 06/26/2021 CLINICAL DATA:  Left lower quadrant abdominal pain with cramping  for 1 day. Therapeutic abortion 5 months ago. LMP 06/07/2021. EXAM: TRANSABDOMINAL AND TRANSVAGINAL ULTRASOUND OF PELVIS DOPPLER ULTRASOUND OF OVARIES TECHNIQUE: Both transabdominal and transvaginal ultrasound examinations of the pelvis were performed. Transabdominal technique was performed for global imaging of the pelvis including uterus, ovaries, adnexal regions, and pelvic cul-de-sac. It was necessary to proceed with endovaginal exam following the transabdominal exam to visualize the endometrium and ovaries to better advantage. Color and duplex Doppler ultrasound was utilized to evaluate blood flow to the ovaries. COMPARISON:  Abdominopelvic CT 08/30/2006 FINDINGS: Uterus Measurements: 9.4 x 3.8 x 4.8 cm = volume: 90.0 mL. No fibroids or other mass visualized. Endometrium Thickness: 14 mm.  No focal abnormality visualized. Right ovary Measurements: 3.5 x 2.9 x 2.6 cm = volume: 14.1 mL. Small follicles with probable collapsing corpus luteum. No suspicious adnexal findings. Normal blood flow with color Doppler. Left ovary Measurements: 3.3 x 2.5 x 2.3 cm = volume: 10.0 mL. Normal blood flow with color Doppler. No suspicious adnexal findings. Pulsed Doppler evaluation of both ovaries demonstrates normal low-resistance arterial and venous waveforms. Other findings Small amount of free pelvic fluid, within physiologic limits. Prominent bowel gas noted in the left lower quadrant. IMPRESSION: 1. No acute pelvic findings or explanation for the patient's symptoms. No suspicious adnexal findings or evidence of ovarian torsion. 2. The uterus and endometrium appear unremarkable. Electronically Signed   By: Carey Bullocks M.D.   On: 06/26/2021 14:48    Procedures Procedures   Medications Ordered in ED Medications  acetaminophen (TYLENOL) tablet 650 mg (650 mg Oral Given 06/26/21 1422)  ibuprofen (ADVIL) tablet 600 mg (600 mg Oral Given 06/26/21 1422)    ED Course  I have reviewed the triage vital signs and the  nursing notes.  Pertinent labs & imaging results that were available during my care of the patient were reviewed by me and considered in my medical decision making (see chart for details).    MDM Rules/Calculators/A&P                           Labs are unremarkable mild white count elevation noted.  Urinalysis negative.  Patient declined narcotic medications.  Ultrasound of the pelvis pursued, normal ultrasound no adnexal mass or torsion noted for ultrasound.  I discussed with patient pursuing a CT image given no cause for her pain.  However she states that her symptoms have improved and declined CT imaging.  She denies any pain at this time.  Pelvic exam performed with nursing chaperone present, no discharge noted.  No cervical motion tenderness no adnexal tenderness noted.  Patient continues states her pain is improved declines further testing will be discharged  home.  Advised immediate return if she has recurrent pain fevers vaginal discharge or bleeding or any additional concerns.  Otherwise discharged home to follow-up with her doctor or OB/GYN team within the next 2 to 4 days.  Final Clinical Impression(s) / ED Diagnoses Final diagnoses:  LLQ pain    Rx / DC Orders ED Discharge Orders     None        Cheryll Cockayne, MD 06/26/21 1458    Cheryll Cockayne, MD 06/26/21 [phone]

## 2021-06-26 NOTE — ED Triage Notes (Signed)
C/o bilateral low abd pain/cramping. States it is the worst cramps of her life but she is not on her period.

## 2021-09-11 ENCOUNTER — Emergency Department (HOSPITAL_BASED_OUTPATIENT_CLINIC_OR_DEPARTMENT_OTHER)
Admission: EM | Admit: 2021-09-11 | Discharge: 2021-09-12 | Disposition: A | Discharge status: Home / Self Care | Payer: Self-pay | Attending: Emergency Medicine | Admitting: Emergency Medicine

## 2021-09-11 ENCOUNTER — Other Ambulatory Visit: Payer: Self-pay

## 2021-09-11 DIAGNOSIS — T7840XA Allergy, unspecified, initial encounter: Secondary | ICD-10-CM | POA: Insufficient documentation

## 2021-09-11 DIAGNOSIS — Z91013 Allergy to seafood: Secondary | ICD-10-CM

## 2021-09-11 MED ORDER — EPINEPHRINE 0.3 MG/0.3ML IJ SOAJ
INTRAMUSCULAR | Status: AC
  Administered 2021-09-11: 0.3 mg via INTRAMUSCULAR
  Filled 2021-09-11: qty 0.3

## 2021-09-11 MED ORDER — DIPHENHYDRAMINE HCL 50 MG/ML IJ SOLN
25.0000 mg | Freq: Once | INTRAMUSCULAR | Status: DC
  Filled 2021-09-11: qty 1

## 2021-09-11 MED ORDER — EPINEPHRINE 0.3 MG/0.3ML IJ SOAJ: 0.3000 mg | Freq: Once | INTRAMUSCULAR | Status: AC

## 2021-09-11 MED ORDER — FAMOTIDINE IN NACL 20-0.9 MG/50ML-% IV SOLN
20.0000 mg | Freq: Once | INTRAVENOUS | Status: DC
  Filled 2021-09-11: qty 50

## 2021-09-11 NOTE — ED Provider Notes (Signed)
[facility] EMERGENCY DEPT Provider Note   CSN: [id number] Arrival date & time: 09/11/21  2139     History  Chief Complaint  Patient presents with   Allergic Reaction    Caroline Walker is a 36 y.o. female.  Patient is a 36 year old female with a history of having allergic reaction in the past and carrying an EpiPen but never having to use it.  She reports that she had gone a long time without eating fish and had fish tonight and shortly afterwards she started feeling like her face was swelling and having itching all over.  She went over to her friend's house asking for help but reports when she got there she started feeling worse like she was going to pass out and start feeling lightheaded.  She currently feels like there is a thickness in her throat and she is having trouble swallowing.  She has not taken anything prior to arrival.  The history is provided by the patient.  Allergic Reaction Presenting symptoms: difficulty breathing, difficulty swallowing, itching and swelling       Home Medications Prior to Admission medications   Medication Sig Start Date End Date Taking? Authorizing Provider  antipyrine-benzocaine Lyla Son) otic solution Place 3-4 drops into the right ear every 2 (two) hours as needed for ear pain. Patient not taking: Reported on 03/15/2018 12/15/14   Eber Hong, MD  doxycycline (VIBRAMYCIN) 100 MG capsule Take 1 capsule (100 mg total) by mouth 2 (two) times daily. 03/15/18   Bing Neighbors, FNP  HYDROcodone-acetaminophen (NORCO/VICODIN) 5-325 MG per tablet Take 2 tablets by mouth every 4 (four) hours as needed. Patient not taking: Reported on 03/15/2018 12/15/14   Eber Hong, MD  naproxen (NAPROSYN) 500 MG tablet Take 1 tablet (500 mg total) by mouth 2 (two) times daily with a meal. Patient not taking: Reported on 03/15/2018 12/15/14   Eber Hong, MD      Allergies    Fish allergy, Bee venom, and Penicillins    Review of Systems    Review of Systems  HENT:  Positive for trouble swallowing.   Skin:  Positive for itching.   Physical Exam Updated Vital Signs BP (!) 135/95 (BP Location: Right Arm)    Pulse (!) 106    Temp 98.2 F (36.8 C) (Oral)    Resp (!) 26    Wt 76.7 kg    LMP 09/02/2021 (Exact Date)    SpO2 100%    BMI 27.28 kg/m  Physical Exam Vitals and nursing note reviewed.  Constitutional:      General: She is not in acute distress.    Appearance: She is well-developed.     Comments: Patient appears very anxious  HENT:     Head: Normocephalic and atraumatic.     Mouth/Throat:     Comments: No notable uvula or tongue swelling Eyes:     Pupils: Pupils are equal, round, and reactive to light.  Neck:     Comments: No voice changes Cardiovascular:     Rate and Rhythm: Normal rate and regular rhythm.     Heart sounds: Normal heart sounds. No murmur heard.   No friction rub.  Pulmonary:     Effort: Pulmonary effort is normal.     Breath sounds: Normal breath sounds. No wheezing or rales.  Abdominal:     General: Bowel sounds are normal. There is no distension.     Palpations: Abdomen is soft.     Tenderness: There is no abdominal  tenderness. There is no guarding or rebound.  Musculoskeletal:        General: No tenderness. Normal range of motion.     Comments: No edema  Skin:    General: Skin is warm and dry.     Findings: Rash present.     Comments: Fine papular rash over the neck  Neurological:     Mental Status: She is alert and oriented to person, place, and time.     Cranial Nerves: No cranial nerve deficit.  Psychiatric:        Behavior: Behavior normal.     Comments: Anxious    ED Results / Procedures / Treatments   Labs (all labs ordered are listed, but only abnormal results are displayed) Labs Reviewed - No data to display  EKG None  Radiology No results found.  Procedures Procedures    Medications Ordered in ED Medications  EPINEPHrine (EPI-PEN) injection 0.3 mg (0.3  mg Intramuscular Given 09/11/21 2151)    ED Course/ Medical Decision Making/ A&P                           Medical Decision Making  Patient is a 36 year old healthy female presenting today with concern for possible allergic reaction.  She ate some type of fish tonight and shortly afterwards started having diffuse itching's.  She is currently complaining of feeling like there is tightness in her throat and she is having difficulty swallowing and breathing.  On exam no obvious sign of oral swelling.  She does not have wheezing on exam.  Vital signs are normal.  However she was given EpiPen based on her complaints.  We will continue to monitor.  She was also given Benadryl and Pepcid.  11:10 PM Pt's symptoms resolved after epi.  Ordered benadryl and pepcid however pt reports that Benadryl makes her have anxiety attacks and she really does not want the Pepcid.  We will continue to monitor prior to discharge.        Final Clinical Impression(s) / ED Diagnoses Final diagnoses:  Allergic reaction, initial encounter    Rx / DC Orders ED Discharge Orders     None         Gwyneth Sprout, MD 09/11/21 2310

## 2021-09-11 NOTE — ED Triage Notes (Signed)
Presents from home for allergic rxn after eating fish,  itching, tongue tingling, feels like "throat is so small". No stridor noted by RT. EDP eval in triage exam.

## 2021-09-11 NOTE — ED Notes (Signed)
Pt refused Benadryl and Pepcid. Dr was made aware.

## 2021-09-12 ENCOUNTER — Encounter (HOSPITAL_BASED_OUTPATIENT_CLINIC_OR_DEPARTMENT_OTHER): Payer: Self-pay | Admitting: Emergency Medicine

## 2021-09-12 ENCOUNTER — Other Ambulatory Visit: Payer: Self-pay

## 2021-09-12 ENCOUNTER — Emergency Department (HOSPITAL_BASED_OUTPATIENT_CLINIC_OR_DEPARTMENT_OTHER)
Admission: EM | Admit: 2021-09-12 | Discharge: 2021-09-12 | Disposition: A | Discharge status: Home / Self Care | Payer: Self-pay | Attending: Emergency Medicine | Admitting: Emergency Medicine

## 2021-09-12 DIAGNOSIS — T7840XA Allergy, unspecified, initial encounter: Secondary | ICD-10-CM | POA: Insufficient documentation

## 2021-09-12 MED ORDER — EPINEPHRINE 0.3 MG/0.3ML IJ SOAJ: 0.3000 mg | INTRAMUSCULAR | 0 refills | Status: DC | PRN

## 2021-09-12 MED ORDER — EPINEPHRINE 0.3 MG/0.3ML IJ SOAJ: INTRAMUSCULAR | 0 refills | Status: AC

## 2021-09-12 MED ORDER — EPINEPHRINE 0.3 MG/0.3ML IJ SOAJ: 0.3000 mg | INTRAMUSCULAR | 0 refills | Status: AC | PRN

## 2021-09-12 NOTE — ED Provider Notes (Signed)
1:58 AM Patient asymptomatic.  She states she is ready to go home.  She would like an EpiPen prescription.  She was advised to avoid all seafood in the future.   Jeronda Don, Jonny Ruiz, MD 09/12/21 [phone]

## 2021-09-12 NOTE — ED Triage Notes (Signed)
Pt reports possible allergic reaction. Pt seen here for same yesterday. Pt reports feels like her throat is closing.

## 2021-09-12 NOTE — ED Provider Notes (Signed)
[facility] EMERGENCY DEPT Provider Note   CSN: [id number] Arrival date & time: 09/12/21  1413     History  Chief Complaint  Patient presents with   Allergic Reaction    Caroline Walker is a 36 y.o. female.  Patient is a 36 yo female presenting for tongue swelling and difficulty swallowing. Pt was seen in ED last night for anaphylaxis after eating Poke bowel with tuna in it. No known fish or food allergies. Patient received epipen in ED with improvement of symptoms. Took pepcid and loratadine, benadryl allergy, last night and today. Currently no drooling, no stridor, no difficulty swallowing.  The history is provided by the patient. No language interpreter was used.  Allergic Reaction Presenting symptoms: no rash       Home Medications Prior to Admission medications   Medication Sig Start Date End Date Taking? Authorizing Provider  EPINEPHrine 0.3 mg/0.3 mL IJ SOAJ injection Self inject per package instructions as needed for allergic reaction. 09/12/21   Molpus, John, MD      Allergies    Fish allergy, Bee venom, and Penicillins    Review of Systems   Review of Systems  Constitutional:  Negative for chills and fever.  HENT:  Negative for ear pain and sore throat.        Tongue swelling   Eyes:  Negative for pain and visual disturbance.  Respiratory:  Negative for cough and shortness of breath.   Cardiovascular:  Negative for chest pain and palpitations.  Gastrointestinal:  Negative for abdominal pain and vomiting.  Genitourinary:  Negative for dysuria and hematuria.  Musculoskeletal:  Negative for arthralgias and back pain.  Skin:  Negative for color change and rash.  Neurological:  Negative for seizures and syncope.  All other systems reviewed and are negative.  Physical Exam Updated Vital Signs BP (!) 141/81 (BP Location: Right Arm)    Pulse 64    Temp 97.8 F (36.6 C)    Resp (!) 21    Ht 5\' 6"  (1.676 m)    Wt 76.7 kg    LMP 09/02/2021 (Exact Date)     SpO2 100%    BMI 27.28 kg/m  Physical Exam Vitals and nursing note reviewed.  Constitutional:      General: She is not in acute distress.    Appearance: She is well-developed.  HENT:     Head: Normocephalic and atraumatic.     Comments: No facial swelling observed.    Mouth/Throat:     Mouth: No angioedema.     Tongue: Tongue does not deviate from midline.     Tonsils: 2+ on the right. 2+ on the left.     Comments: No tongue swelling, trismus, or drooling on exam Eyes:     Conjunctiva/sclera: Conjunctivae normal.  Cardiovascular:     Rate and Rhythm: Normal rate and regular rhythm.     Heart sounds: No murmur heard. Pulmonary:     Effort: Pulmonary effort is normal. No respiratory distress.     Breath sounds: Normal breath sounds.  Abdominal:     Palpations: Abdomen is soft.     Tenderness: There is no abdominal tenderness.  Musculoskeletal:        General: No swelling.     Cervical back: Neck supple.  Skin:    General: Skin is warm and dry.     Capillary Refill: Capillary refill takes less than 2 seconds.  Neurological:     Mental Status: She is alert.  Psychiatric:  Mood and Affect: Mood normal.    ED Results / Procedures / Treatments   Labs (all labs ordered are listed, but only abnormal results are displayed) Labs Reviewed - No data to display  EKG None  Radiology No results found.  Procedures Procedures    Medications Ordered in ED Medications - No data to display  ED Course/ Medical Decision Making/ A&P                           Medical Decision Making  36 yo f presenting for difficulty swallowing after anaphylaxis yesteray. Pt is Aox3, no acute distress, afebrile, with stable vitals. No hypoxia. No stridor. No wheezing or trismus. Pt tolerating secretions without difficulty. No tongue swelling.   Patient in no distress and overall condition improved here in the ED. Recommendations to continue taking medications prescribed last night. Epipen  resent to pharmancy. Detailed discussions were had with the patient regarding current findings, and need for close f/u with PCP or on call doctor. The patient has been instructed to return immediately if the symptoms worsen in any way for re-evaluation. Patient verbalized understanding and is in agreement with current care plan. All questions answered prior to discharge.         Final Clinical Impression(s) / ED Diagnoses Final diagnoses:  Allergic reaction, initial encounter    Rx / DC Orders ED Discharge Orders     None         Lianne Cure, DO [id number]

## 2022-12-27 ENCOUNTER — Encounter (HOSPITAL_BASED_OUTPATIENT_CLINIC_OR_DEPARTMENT_OTHER): Payer: Self-pay

## 2022-12-27 ENCOUNTER — Other Ambulatory Visit: Payer: Self-pay

## 2022-12-27 ENCOUNTER — Other Ambulatory Visit (HOSPITAL_BASED_OUTPATIENT_CLINIC_OR_DEPARTMENT_OTHER): Payer: Self-pay

## 2022-12-27 ENCOUNTER — Emergency Department (HOSPITAL_BASED_OUTPATIENT_CLINIC_OR_DEPARTMENT_OTHER): Payer: Medicaid Other

## 2022-12-27 ENCOUNTER — Emergency Department (HOSPITAL_BASED_OUTPATIENT_CLINIC_OR_DEPARTMENT_OTHER)
Admission: EM | Admit: 2022-12-27 | Discharge: 2022-12-27 | Disposition: A | Discharge status: Home / Self Care | Payer: Medicaid Other | Attending: Emergency Medicine | Admitting: Emergency Medicine

## 2022-12-27 DIAGNOSIS — K5732 Diverticulitis of large intestine without perforation or abscess without bleeding: Secondary | ICD-10-CM | POA: Insufficient documentation

## 2022-12-27 DIAGNOSIS — K5792 Diverticulitis of intestine, part unspecified, without perforation or abscess without bleeding: Secondary | ICD-10-CM

## 2022-12-27 DIAGNOSIS — R1032 Left lower quadrant pain: Secondary | ICD-10-CM | POA: Diagnosis present

## 2022-12-27 LAB — COMPREHENSIVE METABOLIC PANEL
ALT: 16 U/L (ref 0–44)
AST: 16 U/L (ref 15–41)
Albumin: 4.4 g/dL (ref 3.5–5.0)
Alkaline Phosphatase: 68 U/L (ref 38–126)
Anion gap: 8 (ref 5–15)
BUN: 9 mg/dL (ref 6–20)
CO2: 25 mmol/L (ref 22–32)
Calcium: 9.5 mg/dL (ref 8.9–10.3)
Chloride: 103 mmol/L (ref 98–111)
Creatinine, Ser: 0.77 mg/dL (ref 0.44–1.00)
GFR, Estimated: >60 mL/min (ref >60)
Glucose, Bld: 81 mg/dL (ref 70–99)
Potassium: 3.9 mmol/L (ref 3.5–5.1)
Sodium: 136 mmol/L (ref 135–145)
Total Bilirubin: 0.7 mg/dL (ref 0.3–1.2)
Total Protein: 7.4 g/dL (ref 6.5–8.1)

## 2022-12-27 LAB — URINALYSIS, ROUTINE W REFLEX MICROSCOPIC
Bilirubin Urine: NEGATIVE
Glucose, UA: NEGATIVE mg/dL
Hgb urine dipstick: NEGATIVE
Ketones, ur: NEGATIVE mg/dL
Nitrite: NEGATIVE
Specific Gravity, Urine: 1.023 (ref 1.005–1.030)
pH: 6.5 (ref 5.0–8.0)

## 2022-12-27 LAB — CBC
HCT: 42.2 % (ref 36.0–46.0)
Hemoglobin: 13.7 g/dL (ref 12.0–15.0)
MCH: 29.5 pg (ref 26.0–34.0)
MCHC: 32.5 g/dL (ref 30.0–36.0)
MCV: 90.8 fL (ref 80.0–100.0)
Platelets: 325 10*3/uL (ref 150–400)
RBC: 4.65 MIL/uL (ref 3.87–5.11)
RDW: 13.5 % (ref 11.5–15.5)
WBC: 11.1 10*3/uL — ABNORMAL HIGH (ref 4.0–10.5)
nRBC: 0 % (ref 0.0–0.2)

## 2022-12-27 LAB — PREGNANCY, URINE: Preg Test, Ur: NEGATIVE

## 2022-12-27 LAB — LIPASE, BLOOD: Lipase: 31 U/L (ref 11–51)

## 2022-12-27 MED ORDER — AMOXICILLIN-POT CLAVULANATE 875-125 MG PO TABS
1.0000 | ORAL_TABLET | Freq: Once | ORAL | Status: AC
  Administered 2022-12-27: 1 via ORAL
  Filled 2022-12-27: qty 1

## 2022-12-27 MED ORDER — MORPHINE SULFATE (PF) 2 MG/ML IV SOLN
2.0000 mg | Freq: Once | INTRAVENOUS | Status: AC
  Administered 2022-12-27: 2 mg via INTRAVENOUS
  Filled 2022-12-27: qty 1

## 2022-12-27 MED ORDER — HYDROCODONE-ACETAMINOPHEN 5-325 MG PO TABS: 2.0000 | ORAL_TABLET | Freq: Four times a day (QID) | ORAL | 0 refills | Status: DC | PRN

## 2022-12-27 MED ORDER — ONDANSETRON HCL 4 MG PO TABS: 4.0000 mg | ORAL_TABLET | Freq: Four times a day (QID) | ORAL | 0 refills | Status: DC | PRN

## 2022-12-27 MED ORDER — ONDANSETRON HCL 4 MG/2ML IJ SOLN
4.0000 mg | Freq: Once | INTRAMUSCULAR | Status: AC
  Administered 2022-12-27: 4 mg via INTRAVENOUS
  Filled 2022-12-27: qty 2

## 2022-12-27 MED ORDER — IOHEXOL 300 MG/ML  SOLN
100.0000 mL | Freq: Once | INTRAMUSCULAR | Status: AC | PRN
  Administered 2022-12-27: 85 mL via INTRAVENOUS

## 2022-12-27 MED ORDER — AMOXICILLIN-POT CLAVULANATE 875-125 MG PO TABS: 1.0000 | ORAL_TABLET | Freq: Two times a day (BID) | ORAL | 0 refills | Status: DC

## 2022-12-27 NOTE — ED Triage Notes (Signed)
Patient here POV from Home.  Endorses Pain to LLQ that began Sunday PM. Remained Consistent since. No N/V/D. Noted Heartburn yesterday.   NAD Noted during Triage. A&Ox4. GCS 15. Ambulatory.

## 2022-12-27 NOTE — Discharge Instructions (Addendum)
Please return to the ED with any new or worsening signs or symptoms such as blood in stool, increased abdominal pain despite antibiotic therapy Please read attached guide concerning diverticulitis Please begin taking antibiotics for diverticulitis.  He will take Augmentin twice daily for 10 days Please take pain medication every 6 hours as needed for pain Please follow-up with gastroenterology.  Their information is on this form.  You will most likely need colonoscopy within 6 weeks. Please take Zofran every 6 hours as needed for nausea Please see attached work and school note

## 2022-12-27 NOTE — ED Notes (Signed)
Pt discharged to home using teachback Method. Discharge instructions have been discussed with patient and/or family members. Pt verbally acknowledges understanding d/c instructions, has been given opportunity for questions to be answered, and endorses comprehension to checkout at registration before leaving.  

## 2022-12-27 NOTE — ED Provider Notes (Signed)
[place] EMERGENCY DEPARTMENT AT [facility] Provider Note   CSN: [id number] Arrival date & time: 12/27/22  1048     History  Chief Complaint  Patient presents with   Abdominal Pain    Caroline Walker is a 37 y.o. female with medical history of kidney infection.  Patient presents to ED for evaluation of left lower quadrant abdominal pain.  Patient reports that on Saturday night she went out and drank.  Patient reports that on Sunday she had a large meal, then went to a baby shower.  Patient reports that prior to going to the baby shower, she had a very large watery bowel movement.  Patient states that baby shower, she had food such as sour cream and potato salad.  Patient reports she is lactose intolerant, will occasionally have dairy based products and will have resulting constipation.  Patient reports that ever since Sunday she has not had a bowel movement.  Patient reports that Sunday night she developed left lower quadrant abdominal pain described as "someone blowing a balloon up in my belly".  Patient reports that ever since Sunday she has been bloated, nauseous and constipated.  Patient reports last menstrual period was on 4/7.  Patient denies vomiting, flank pain.  Patient also reporting dysuria however states that he does not hurt to pee necessarily, it just exacerbates her left lower quadrant pain.  Patient reports that her mother has diverticulitis and she is concerned about this.  The patient denies any fevers, chest pain or shortness of breath.   Abdominal Pain Associated symptoms: constipation, dysuria and nausea   Associated symptoms: no fever and no vomiting        Home Medications Prior to Admission medications   Medication Sig Start Date End Date Taking? Authorizing Provider  amoxicillin-clavulanate (AUGMENTIN) 875-125 MG tablet Take 1 tablet by mouth every 12 (twelve) hours. 12/27/22  Yes Al Decant, PA-C  HYDROcodone-acetaminophen (NORCO/VICODIN)  5-325 MG tablet Take 2 tablets by mouth every 6 (six) hours as needed for severe pain. 12/27/22  Yes Al Decant, PA-C  ondansetron (ZOFRAN) 4 MG tablet Take 1 tablet (4 mg total) by mouth every 6 (six) hours as needed for nausea or vomiting. 12/27/22  Yes Al Decant, PA-C  EPINEPHrine 0.3 mg/0.3 mL IJ SOAJ injection Self inject per package instructions as needed for allergic reaction. 09/12/21   Molpus, John, MD  EPINEPHrine 0.3 mg/0.3 mL IJ SOAJ injection Inject 0.3 mg into the muscle as needed for anaphylaxis. 09/12/21   Franne Forts, DO      Allergies    Fish allergy, Bee venom, and Penicillins    Review of Systems   Review of Systems  Constitutional:  Negative for fever.  Gastrointestinal:  Positive for abdominal pain, constipation and nausea. Negative for vomiting.  Genitourinary:  Positive for dysuria. Negative for flank pain.  All other systems reviewed and are negative.   Physical Exam Updated Vital Signs BP 134/75   Pulse 79   Temp 98.2 F (36.8 C) (Oral)   Resp 20   Ht  (1.702 m)   Wt 78 kg   SpO2 100%   BMI 26.94 kg/m  Physical Exam Vitals and nursing note reviewed.  Constitutional:      General: She is not in acute distress.    Appearance: She is not toxic-appearing.  HENT:     Head: Normocephalic and atraumatic.     Nose: Nose normal.     Mouth/Throat:  Mouth: Mucous membranes are moist.     Pharynx: Oropharynx is clear.  Eyes:     Extraocular Movements: Extraocular movements intact.     Conjunctiva/sclera: Conjunctivae normal.     Pupils: Pupils are equal, round, and reactive to light.  Cardiovascular:     Rate and Rhythm: Normal rate and regular rhythm.  Pulmonary:     Effort: Pulmonary effort is normal.     Breath sounds: Normal breath sounds. No wheezing.  Abdominal:     General: Abdomen is flat. There is no distension.     Tenderness: There is abdominal tenderness. There is guarding.  Musculoskeletal:     Cervical back:  Normal range of motion and neck supple. No tenderness.  Skin:    General: Skin is warm and dry.     Capillary Refill: Capillary refill takes less than 2 seconds.  Neurological:     Mental Status: She is alert and oriented to person, place, and time.     ED Results / Procedures / Treatments   Labs (all labs ordered are listed, but only abnormal results are displayed) Labs Reviewed  CBC - Abnormal; Notable for the following components:      Result Value   WBC 11.1 (*)    All other components within normal limits  URINALYSIS, ROUTINE W REFLEX MICROSCOPIC - Abnormal; Notable for the following components:   APPearance HAZY (*)    Protein, ur TRACE (*)    Leukocytes,Ua SMALL (*)    Bacteria, UA MANY (*)    All other components within normal limits  LIPASE, BLOOD  COMPREHENSIVE METABOLIC PANEL  PREGNANCY, URINE    EKG None  Radiology CT ABDOMEN PELVIS W CONTRAST  Result Date: 12/27/2022 CLINICAL DATA:  Diverticulitis. EXAM: CT ABDOMEN AND PELVIS WITH CONTRAST TECHNIQUE: Multidetector CT imaging of the abdomen and pelvis was performed using the standard protocol following bolus administration of intravenous contrast. RADIATION DOSE REDUCTION: This exam was performed according to the departmental dose-optimization program which includes automated exposure control, adjustment of the mA and/or kV according to patient size and/or use of iterative reconstruction technique. CONTRAST:  85mL OMNIPAQUE IOHEXOL 300 MG/ML  SOLN COMPARISON:  CT 08/30/2006.  Ultrasound pelvis 06/26/2021 FINDINGS: Lower chest: No acute abnormality.  Small hiatal hernia. Hepatobiliary: No focal liver abnormality is seen. No gallstones, gallbladder wall thickening, or biliary dilatation. Pancreas: Unremarkable. No pancreatic ductal dilatation or surrounding inflammatory changes. Spleen: Normal in size without focal abnormality. Adrenals/Urinary Tract: Adrenal glands are unremarkable. Kidneys are normal, without renal  calculi, focal lesion, or hydronephrosis. Bladder is unremarkable. Stomach/Bowel: Atypical distribution of bowel. The third portion of the duodenal does not cross all the way to the left side of the spine and there is more small bowel in the right side of the abdomen than left. The colon is predominantly along the left side as well. There are some sigmoid and descending colon diverticula with an area of wall thickening proximal sigmoid colon with stranding consistent with an area of diverticulitis. No complicating features of free air, obstruction or fluid collection. Normal appendix extends superior to the cecum. The cecum resides in the upper central pelvis above the uterus. Vascular/Lymphatic: No significant vascular findings are present. No enlarged abdominal or pelvic lymph nodes. Reproductive: Uterus and bilateral adnexa are unremarkable. Other: Trace free fluid in the pelvis.  Umbilical jewelry. Musculoskeletal: No acute or significant osseous findings. IMPRESSION: Proximal sigmoid colon diverticula with stranding and wall thickening consistent with an area of simple diverticulitis. No complicating features  at this time. Recommend follow-up to confirm clearance and exclude secondary pathology. No obstruction. Normal appendix. Again changes noted of congenital malrotation. Small hiatal hernia Electronically Signed   By: Karen Kays M.D.   On: 12/27/2022 12:47    Procedures Procedures   Medications Ordered in ED Medications  ondansetron (ZOFRAN) injection 4 mg (4 mg Intravenous Given 12/27/22 1233)  morphine (PF) 2 MG/ML injection 2 mg (2 mg Intravenous Given 12/27/22 1233)  iohexol (OMNIPAQUE) 300 MG/ML solution 100 mL (85 mLs Intravenous Contrast Given 12/27/22 1222)  amoxicillin-clavulanate (AUGMENTIN) 875-125 MG per tablet 1 tablet (1 tablet Oral Given 12/27/22 1349)    ED Course/ Medical Decision Making/ A&P  Medical Decision Making Amount and/or Complexity of Data Reviewed Labs:  ordered. Radiology: ordered. ECG/medicine tests: ordered.  Risk Prescription drug management.   37 year old female presents to the ED for evaluation.  Please see HPI for further details.  On examination the patient is afebrile, nontachycardic.  Lung sounds are clear bilaterally, she is not hypoxic.  Her abdome has tenderness in the left lower quadrant with guarding.  No CVA tenderness bilaterally.  Patient alert and oriented x 3.  CBC with leukocytosis to 11, no anemia.  CMP without electrolyte derangement, no elevated creatinine.  Urinalysis shows trace protein, leukocytes.  Pregnancy test negative.  CT abdomen pelvis shows proximal sigmoid colon diverticula with stranding and wall thickening consistent with an area of simple diverticulitis without complicating feature shown.  Patient provided for milligram Zofran for nausea, 2 mg morphine for pain.  Patient also provided initial dose of Augmentin here she states that she has had questionable allergic reactions in the past include hives.  The patient was observed for 30 minutes after giving Augmentin, she showed no evidence of anaphylaxis or allergic response.  Patient pain is well-controlled at this time.  The patient is had no episodes of nausea or vomiting here.  At this time the patient will be discharged home with referral to gastroenterology.  Patient will be sent home on Augmentin which she will take for the next 10 days twice daily.  The patient was advised to return to the ED if her pain worsens or she does not have any resolution of pain.  The patient was enema Zofran as well and written out of work and school.  Return precautions were provided and the patient voiced understanding.  Patient had all her questions answered to her satisfaction.  Patient stable to discharge at this time.   Final Clinical Impression(s) / ED Diagnoses Final diagnoses:  Diverticulitis    Rx / DC Orders ED Discharge Orders          Ordered     amoxicillin-clavulanate (AUGMENTIN) 875-125 MG tablet  Every 12 hours        12/27/22 1521    ondansetron (ZOFRAN) 4 MG tablet  Every 6 hours PRN        12/27/22 1521    HYDROcodone-acetaminophen (NORCO/VICODIN) 5-325 MG tablet  Every 6 hours PRN        12/27/22 1524              Al Decant, PA-C 12/27/22 1525    Horton, Clabe Seal, DO 12/28/22 [phone]

## 2022-12-27 NOTE — ED Notes (Signed)
Pt endorses understanding needing ride when d/c

## 2023-01-30 IMAGING — US US PELVIS COMPLETE TRANSABD/TRANSVAG W DUPLEX
1 series · 13 of 25 positions shown · non-contrast
Comparison: Abdominopelvic CT 08/30/2006

CLINICAL DATA: Left lower quadrant abdominal pain with cramping for
1 day. Therapeutic abortion 5 months ago. LMP 06/07/2021.

EXAM:
TRANSABDOMINAL AND TRANSVAGINAL ULTRASOUND OF PELVIS
DOPPLER ULTRASOUND OF OVARIES
TECHNIQUE: Both transabdominal and transvaginal ultrasound examinations of the
pelvis were performed. Transabdominal technique was performed for
global imaging of the pelvis including uterus, ovaries, adnexal
regions, and pelvic cul-de-sac.
It was necessary to proceed with endovaginal exam following the
transabdominal exam to visualize the endometrium and ovaries to
better advantage. Color and duplex Doppler ultrasound was utilized
to evaluate blood flow to the ovaries.

[Series 1: us pelvic complete w transvaginal and torsion righ · 13 of 143 slices shown]
[im 1/143]
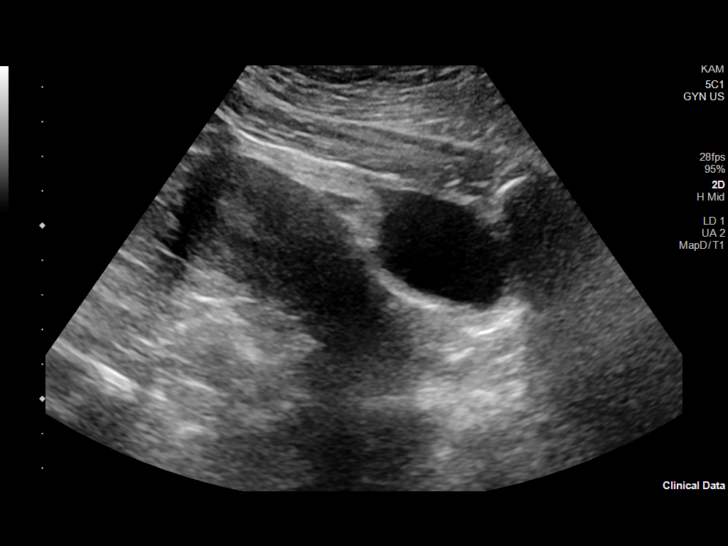
[im 12/143]
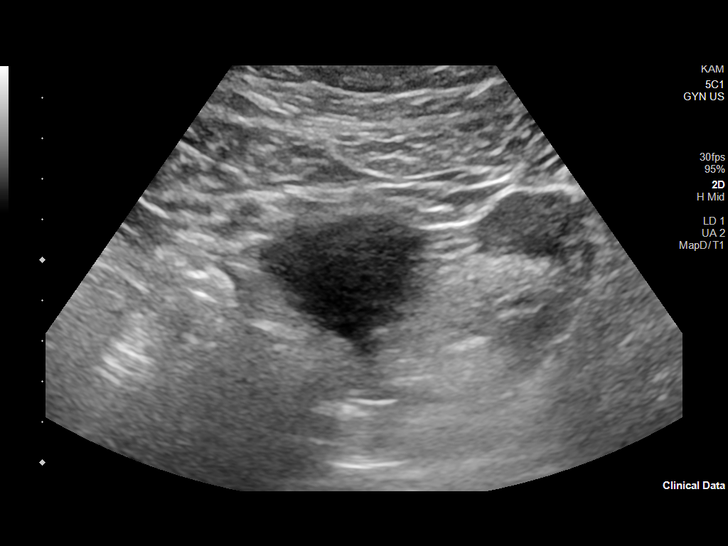
[im 24/143]
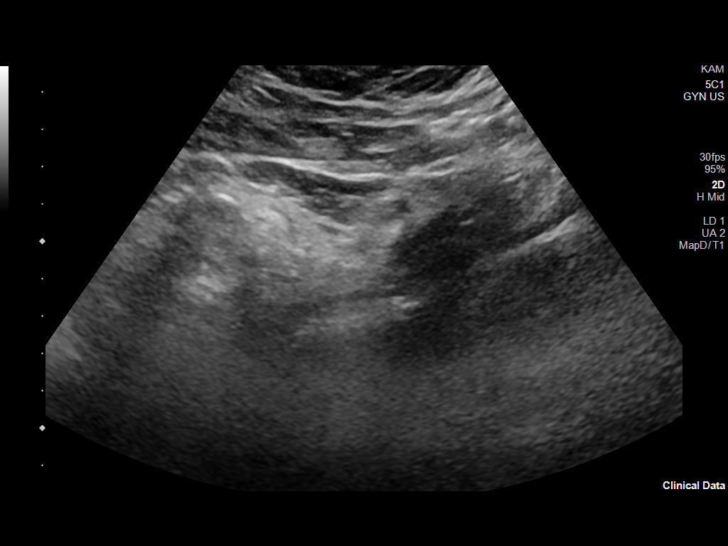
[im 36/143]
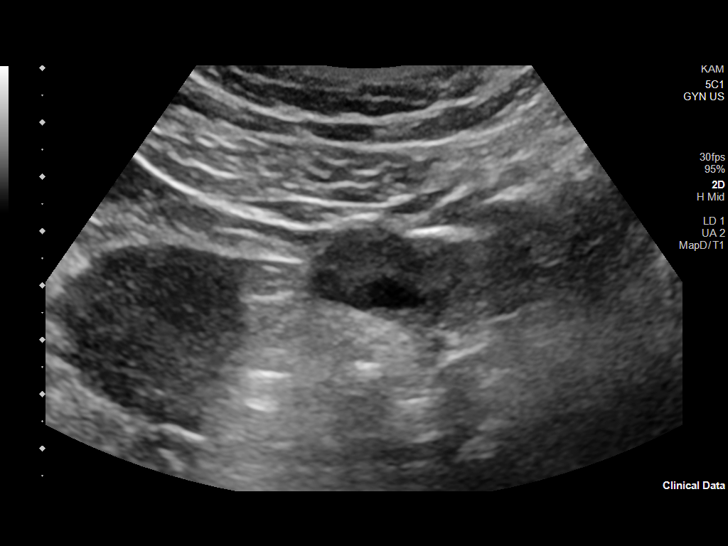
[im 48/143]
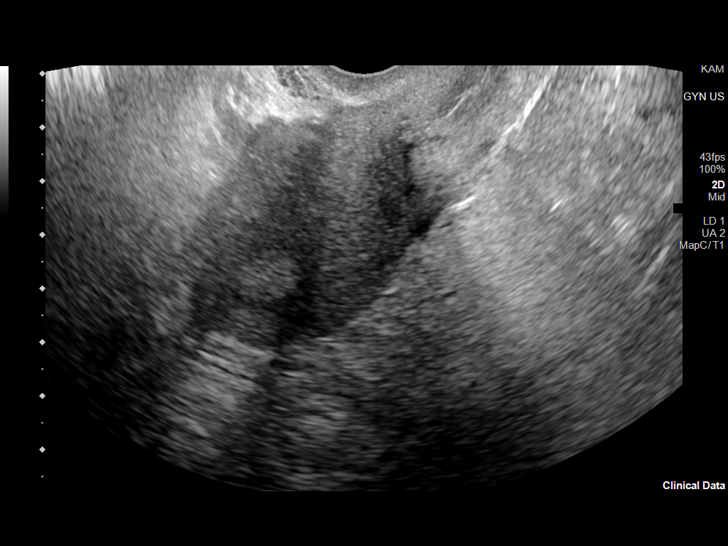
[im 60/143]
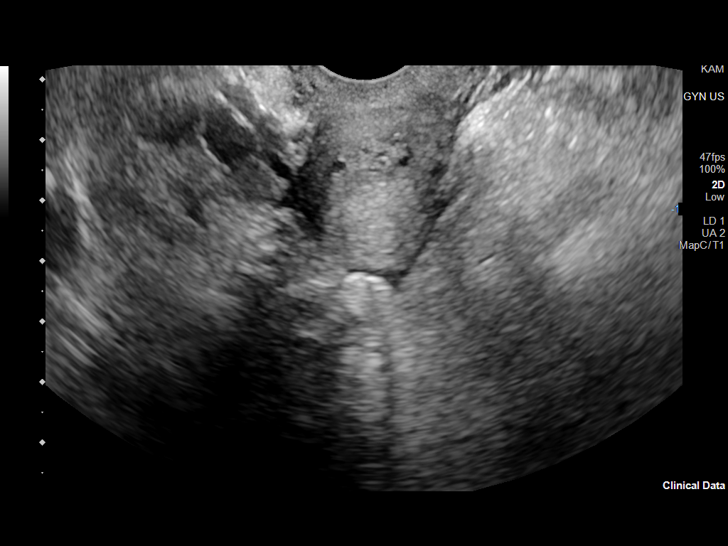
[im 72/143]
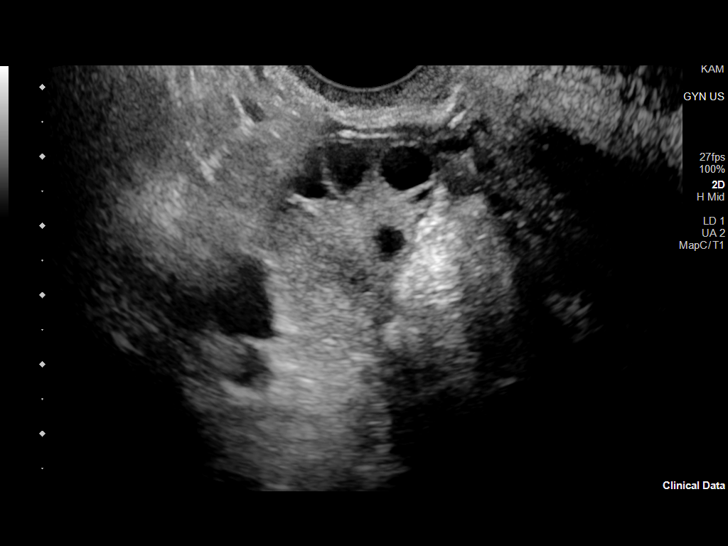
[im 83/143]
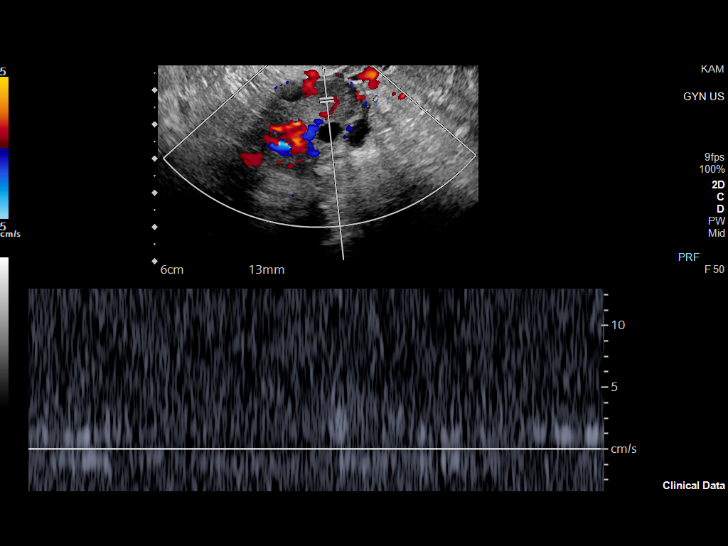
[im 95/143]
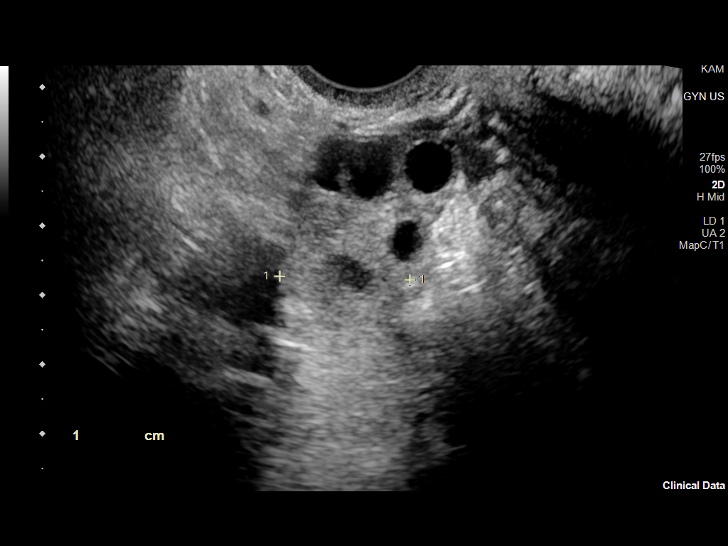
[im 107/143]
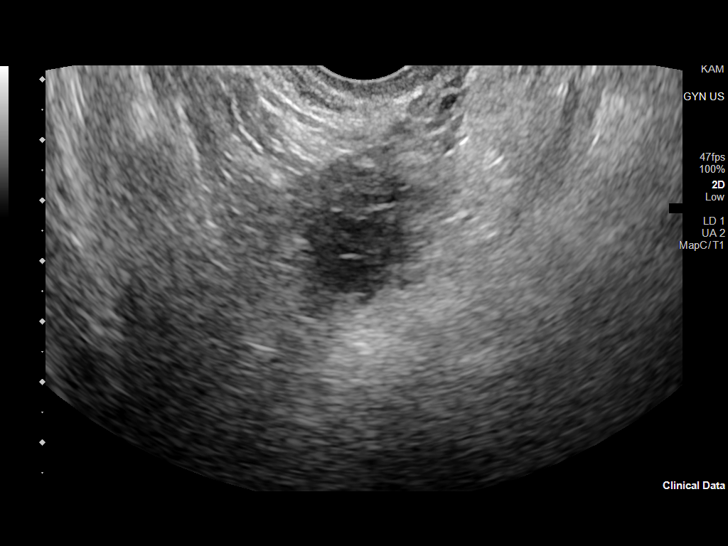
[im 119/143]
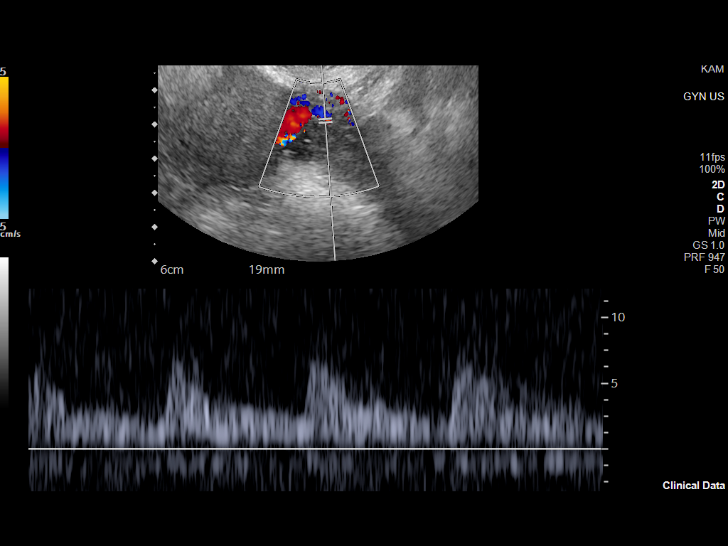
[im 131/143]
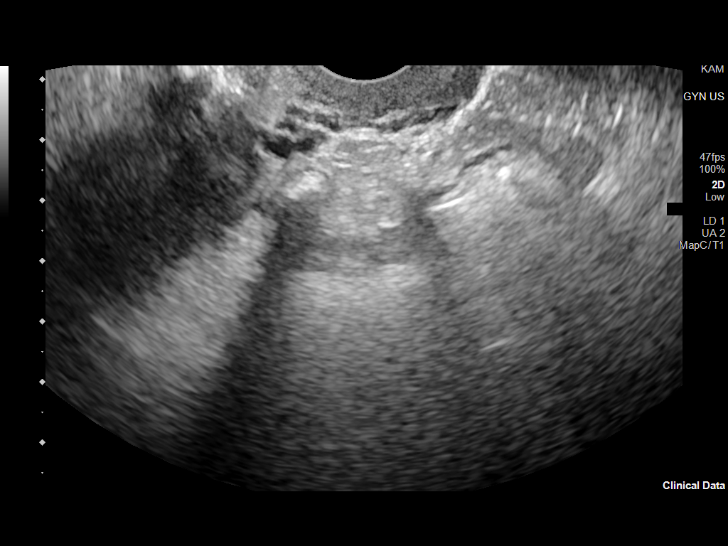
[im 143/143]
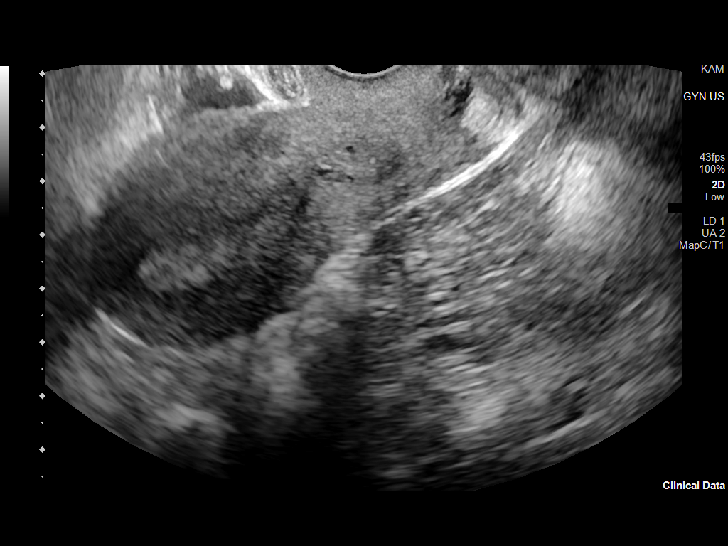

[13 of 25 positions shown; findings below may reference images not displayed]

FINDINGS: Uterus

Measurements: 9.4 x 3.8 x 4.8 cm = volume: 90.0 mL. No fibroids or
other mass visualized.

Endometrium

Thickness: 14 mm.  No focal abnormality visualized.

Right ovary

Measurements: 3.5 x 2.9 x 2.6 cm = volume: 14.1 mL. Small follicles
with probable collapsing corpus luteum. No suspicious adnexal
findings. Normal blood flow with color Doppler.

Left ovary

Measurements: 3.3 x 2.5 x 2.3 cm = volume: 10.0 mL. Normal blood
flow with color Doppler. No suspicious adnexal findings.

Pulsed Doppler evaluation of both ovaries demonstrates normal
low-resistance arterial and venous waveforms.

Other findings

Small amount of free pelvic fluid, within physiologic limits.
Prominent bowel gas noted in the left lower quadrant.
IMPRESSION: 1. No acute pelvic findings or explanation for the patient's
symptoms. No suspicious adnexal findings or evidence of ovarian
torsion.
2. The uterus and endometrium appear unremarkable.

## 2024-03-10 ENCOUNTER — Emergency Department (HOSPITAL_BASED_OUTPATIENT_CLINIC_OR_DEPARTMENT_OTHER): Payer: Self-pay

## 2024-03-10 ENCOUNTER — Encounter (HOSPITAL_BASED_OUTPATIENT_CLINIC_OR_DEPARTMENT_OTHER): Payer: Self-pay

## 2024-03-10 ENCOUNTER — Emergency Department (HOSPITAL_BASED_OUTPATIENT_CLINIC_OR_DEPARTMENT_OTHER)
Admission: EM | Admit: 2024-03-10 | Discharge: 2024-03-10 | Disposition: A | Discharge status: Home / Self Care | Payer: Self-pay | Attending: Emergency Medicine | Admitting: Emergency Medicine

## 2024-03-10 ENCOUNTER — Other Ambulatory Visit: Payer: Self-pay

## 2024-03-10 ENCOUNTER — Emergency Department (HOSPITAL_BASED_OUTPATIENT_CLINIC_OR_DEPARTMENT_OTHER): Payer: Self-pay | Admitting: Radiology

## 2024-03-10 DIAGNOSIS — K529 Noninfective gastroenteritis and colitis, unspecified: Secondary | ICD-10-CM | POA: Insufficient documentation

## 2024-03-10 DIAGNOSIS — M25562 Pain in left knee: Secondary | ICD-10-CM | POA: Diagnosis not present

## 2024-03-10 DIAGNOSIS — F17210 Nicotine dependence, cigarettes, uncomplicated: Secondary | ICD-10-CM | POA: Diagnosis not present

## 2024-03-10 DIAGNOSIS — R197 Diarrhea, unspecified: Secondary | ICD-10-CM | POA: Diagnosis present

## 2024-03-10 LAB — COMPREHENSIVE METABOLIC PANEL WITH GFR
ALT: 31 U/L (ref 0–44)
AST: 18 U/L (ref 15–41)
Albumin: 3.7 g/dL (ref 3.5–5.0)
Alkaline Phosphatase: 117 U/L (ref 38–126)
Anion gap: 11 (ref 5–15)
BUN: 11 mg/dL (ref 6–20)
CO2: 22 mmol/L (ref 22–32)
Calcium: 8.9 mg/dL (ref 8.9–10.3)
Chloride: 104 mmol/L (ref 98–111)
Creatinine, Ser: 0.78 mg/dL (ref 0.44–1.00)
GFR, Estimated: >60 mL/min (ref >60)
Glucose, Bld: 95 mg/dL (ref 70–99)
Potassium: 3.6 mmol/L (ref 3.5–5.1)
Sodium: 137 mmol/L (ref 135–145)
Total Bilirubin: 0.5 mg/dL (ref 0.0–1.2)
Total Protein: 6.5 g/dL (ref 6.5–8.1)

## 2024-03-10 LAB — URINALYSIS, ROUTINE W REFLEX MICROSCOPIC
Bacteria, UA: NONE SEEN
Bilirubin Urine: NEGATIVE
Glucose, UA: NEGATIVE mg/dL
Ketones, ur: NEGATIVE mg/dL
Leukocytes,Ua: NEGATIVE
Nitrite: NEGATIVE
Protein, ur: NEGATIVE mg/dL
Specific Gravity, Urine: >1.046 — ABNORMAL HIGH (ref 1.005–1.030)
pH: 6 (ref 5.0–8.0)

## 2024-03-10 LAB — CBC
HCT: 38.9 % (ref 36.0–46.0)
Hemoglobin: 12.9 g/dL (ref 12.0–15.0)
MCH: 29.1 pg (ref 26.0–34.0)
MCHC: 33.2 g/dL (ref 30.0–36.0)
MCV: 87.8 fL (ref 80.0–100.0)
Platelets: 308 K/uL (ref 150–400)
RBC: 4.43 MIL/uL (ref 3.87–5.11)
RDW: 13.3 % (ref 11.5–15.5)
WBC: 9.2 K/uL (ref 4.0–10.5)
nRBC: 0 % (ref 0.0–0.2)

## 2024-03-10 LAB — HCG, QUANTITATIVE, PREGNANCY: hCG, Beta Chain, Quant, S: <1 m[IU]/mL (ref <5)

## 2024-03-10 LAB — PREGNANCY, URINE: Preg Test, Ur: NEGATIVE

## 2024-03-10 LAB — OCCULT BLOOD X 1 CARD TO LAB, STOOL: Fecal Occult Bld: NEGATIVE

## 2024-03-10 LAB — LIPASE, BLOOD: Lipase: 43 U/L (ref 11–51)

## 2024-03-10 MED ORDER — SODIUM CHLORIDE 0.9 % IV BOLUS
1000.0000 mL | Freq: Once | INTRAVENOUS | Status: AC
  Administered 2024-03-10: 1000 mL via INTRAVENOUS

## 2024-03-10 MED ORDER — ONDANSETRON 4 MG PO TBDP
4.0000 mg | ORAL_TABLET | Freq: Three times a day (TID) | ORAL | 0 refills | Status: AC | PRN
Start: 2024-03-10 — End: ?

## 2024-03-10 MED ORDER — CIPROFLOXACIN HCL 500 MG PO TABS: 500.0000 mg | ORAL_TABLET | Freq: Two times a day (BID) | ORAL | 0 refills | Status: DC

## 2024-03-10 MED ORDER — ONDANSETRON HCL 4 MG/2ML IJ SOLN
4.0000 mg | Freq: Once | INTRAMUSCULAR | Status: AC
  Administered 2024-03-10: 4 mg via INTRAVENOUS
  Filled 2024-03-10: qty 2

## 2024-03-10 MED ORDER — ONDANSETRON 4 MG PO TBDP: 4.0000 mg | ORAL_TABLET | Freq: Three times a day (TID) | ORAL | 0 refills | Status: DC | PRN

## 2024-03-10 MED ORDER — MORPHINE SULFATE (PF) 4 MG/ML IV SOLN
4.0000 mg | Freq: Once | INTRAVENOUS | Status: AC
  Administered 2024-03-10: 4 mg via INTRAVENOUS
  Filled 2024-03-10: qty 1

## 2024-03-10 MED ORDER — IOHEXOL 300 MG/ML  SOLN
80.0000 mL | Freq: Once | INTRAMUSCULAR | Status: AC | PRN
  Administered 2024-03-10: 80 mL via INTRAVENOUS

## 2024-03-10 NOTE — ED Notes (Signed)
 Dc instructions reviewed with patient. Patient voiced understanding. Dc with belongings.

## 2024-03-10 NOTE — Discharge Instructions (Signed)
 As discussed, CT scan concerning for colitis.  Given your recent international travel, we will treat this with antibiotics.  Recommend over-the-counter Imodium/Pepto-Bismol for any persistent/excessive diarrhea.  Recommend oral hydration via electrolyte rich fluids such as Pedialyte, sugar-free Gatorade, liquid IV.  Regarding your left knee pain, we will send a referral back into [facility].  I think you would benefit from an MRI for assessment of your meniscus as well as ligaments in your left knee.  We have sent in a stool sample as well we will call you with results of abnormal.  Please do not hesitate to return to emergency department if the worrisome signs and symptoms we discussed become apparent.

## 2024-03-10 NOTE — ED Provider Notes (Signed)
  EMERGENCY DEPARTMENT AT [facility] Provider Note   CSN: [id number] Arrival date & time: 03/10/24  1429     Patient presents with: Knee Pain and Diarrhea   Caroline Walker is a 38 y.o. female.    Knee Pain Diarrhea   38 year old female presents emergency department with a couple different complaints.  States that on 6/25 was in [place].  States that her left knee went inward and she heard a snapping sensation.  Has intermittent crippling catching sensation in the left knee whenever she is walking with pain on the inner aspect.  States that she had an outpatient x-ray done by [facility] which was reassuring.  States that she has had continued pain and swelling.  They were concerned about meniscal injury and told her to follow-up if pain worsened or do not get better.  States that symptoms seem as if they are becoming more frequent and more painful.  Patient also describes loose bowel movements.  States that since July 1 has had loose bowel movements with some bright red blood mixed in.  Denies gross medic easier.  Denies any blood thinner use.  States that she has having some rectal discomfort but does not know if this is related to increased diarrhea or if she has hemorrhoids.  Denies any fevers, chills, night sweats, chest pain, shortness of breath, urinary symptoms.  States that she just ended her menstrual cycle 2 days ago.  States that she is having abdominal pain in her left lower abdomen without radiation.  Past medical history significant for diverticulitis  Prior to Admission medications   Medication Sig Start Date End Date Taking? Authorizing Provider  amoxicillin -clavulanate (AUGMENTIN ) 875-125 MG tablet Take 1 tablet by mouth every 12 (twelve) hours. 12/27/22   Ruthell Lonni FALCON, PA-C  EPINEPHrine  0.3 mg/0.3 mL IJ SOAJ injection Self inject per package instructions as needed for allergic reaction. 09/12/21   Molpus, John, MD  EPINEPHrine  0.3 mg/0.3 mL IJ SOAJ  injection Inject 0.3 mg into the muscle as needed for anaphylaxis. 09/12/21   Elnor Bernarda SQUIBB, DO  HYDROcodone -acetaminophen  (NORCO/VICODIN) 5-325 MG tablet Take 2 tablets by mouth every 6 (six) hours as needed for severe pain. 12/27/22   Ruthell Lonni FALCON, PA-C  ondansetron  (ZOFRAN ) 4 MG tablet Take 1 tablet (4 mg total) by mouth every 6 (six) hours as needed for nausea or vomiting. 12/27/22   Ruthell Lonni FALCON, PA-C    Allergies: Fish allergy, Bee venom, and Penicillins    Review of Systems  Gastrointestinal:  Positive for diarrhea.  All other systems reviewed and are negative.   Updated Vital Signs BP (!) 125/90 (BP Location: Right Arm)   Pulse 95   Temp 99 F (37.2 C)   Resp 18   Ht 5' 7 (1.702 m)   Wt 70.3 kg   LMP 03/08/2024   SpO2 97%   BMI 24.28 kg/m   Physical Exam Vitals and nursing note reviewed.  Constitutional:      General: She is not in acute distress.    Appearance: She is well-developed.  HENT:     Head: Normocephalic and atraumatic.  Eyes:     Conjunctiva/sclera: Conjunctivae normal.  Cardiovascular:     Rate and Rhythm: Normal rate and regular rhythm.     Heart sounds: No murmur heard. Pulmonary:     Effort: Pulmonary effort is normal. No respiratory distress.     Breath sounds: Normal breath sounds.  Abdominal:     Palpations: Abdomen is  soft.     Tenderness: There is abdominal tenderness in the left lower quadrant.  Musculoskeletal:        General: No swelling.     Cervical back: Neck supple.     Comments: Joint swelling left knee compared to right.  No obvious medial or lateral joint line tenderness.  No patellar tenderness.  Patient able to range left knee with pain with extreme flexion as well as extreme extension.  No obvious joint laxity appreciated anterior/posterior drawer, valgus/varus stress.  Pedal and posterior tibial pulses 2+ bilaterally.  Skin:    General: Skin is warm and dry.     Capillary Refill: Capillary refill takes less than  2 seconds.  Neurological:     Mental Status: She is alert.  Psychiatric:        Mood and Affect: Mood normal.     (all labs ordered are listed, but only abnormal results are displayed) Labs Reviewed  LIPASE, BLOOD  COMPREHENSIVE METABOLIC PANEL WITH GFR  CBC  URINALYSIS, ROUTINE W REFLEX MICROSCOPIC  PREGNANCY, URINE  OCCULT BLOOD X 1 CARD TO LAB, STOOL    EKG: None  Radiology: No results found.   Procedures   Medications Ordered in the ED - No data to display                                  Medical Decision Making Amount and/or Complexity of Data Reviewed Labs: ordered. Radiology: ordered.  Risk Prescription drug management.   This patient presents to the ED for concern of knee pain, diarrhea, this involves an extensive number of treatment options, and is a complaint that carries with it a high risk of complications and morbidity.  The differential diagnosis includes fracture, strain/sprain, dislocation, ligamentous/tendinous injury, neurovasc compromise, septic arthritis, osteoarthritis, foodborne illness, viral gastroenteritis, food intolerance/allergy, diverticulitis, appendicitis, SBO/LBO, volvulus, malignancy, medication side effect, other   Co morbidities that complicate the patient evaluation  See HPI   Additional history obtained:  Additional history obtained from EMR External records from outside source obtained and reviewed including hospital records   Lab Tests:  I Ordered, and personally interpreted labs.  The pertinent results include: No leukocytosis.  No evidence of anemia.  Lites within range.  No trichomonas.  No transaminitis.  No renal dysfunction.  Lipase within normal limits.  hCG negative.**   Imaging Studies ordered:  I ordered imaging studies including CT abdomen pelvis I independently visualized and interpreted imaging which showed bowel wall thickening of ascending and transverse colon concerning for colitis.  Small bowel  rotation I agree with the radiologist interpretation   Cardiac Monitoring: / EKG:  The patient was maintained on a cardiac monitor.  I personally viewed and interpreted the cardiac monitored which showed an underlying rhythm of: Sinus rhythm   Consultations Obtained:  N/a   Problem List / ED Course / Critical interventions / Medication management  Colitis I ordered medication including Zofran , morphine , normal saline   Reevaluation of the patient after these medicines showed that the patient improved I have reviewed the patients home medicines and have made adjustments as needed   Social Determinants of Health:  Cigarette use.  Denies illicit drug use.   Test / Admission - Considered:  Colitis, left knee pain Vitals signs  within normal range and stable throughout visit. Laboratory/imaging studies significant for: See above 38 year old female presents emergency department with a couple different complaints.  States that  on 6/25 was in [place].  States that her left knee went inward and she heard a snapping sensation.  Has intermittent crippling catching sensation in the left knee whenever she is walking with pain on the inner aspect.  States that she had an outpatient x-ray done by [facility] which was reassuring.  States that she has had continued pain and swelling.  They were concerned about meniscal injury and told her to follow-up if pain worsened or do not get better.  States that symptoms seem as if they are becoming more frequent and more painful.  Patient also describes loose bowel movements.  States that since July 1 has had loose bowel movements with some bright red blood mixed in.  Denies gross medic easier.  Denies any blood thinner use.  States that she has having some rectal discomfort but does not know if this is related to increased diarrhea or if she has hemorrhoids.  Denies any fevers, chills, night sweats, chest pain, shortness of breath, urinary symptoms.  States  that she just ended her menstrual cycle 2 days ago.  States that she is having abdominal pain in her left lower abdomen without radiation. On exam, left-sided abdominal tenderness appreciated.  Labs reassuring without acute emergent process.  Occult was negative.  GI panel pending.  CT imaging concerning for colitis is most likely etiology of patient's symptoms.  Given recent international travel, will treat empirically with antibiotics given concern for traveler's diarrhea.  Will recommend symptomatic therapy as described in AVS with close follow-up with PCP/GI in the outpatient setting for reassessment.  Regarding patient's left knee pain, already evaluated by outpatient orthopedic provider through [facility].  Concern for ligamentous/meniscal injury of left knee.  No overlying skin changes concerning for secondary infectious process.  Patient able to range left knee and walk on the left leg; low suspicion for septic arthritis.  No pulse deficits to suggest ischemic limb.  No lower extremity edema concerning for DVT.  No new traumatic mechanism to be suspicious for fracture or dislocation especially without any new bony tenderness.  Will refer back into [facility] for reassessment.  Treatment plan discussed with patient and she acknowledged understanding was agreeable to said plan.  Patient overall well-appearing, afebrile in no acute distress. Worrisome signs and symptoms were discussed with the patient, and the patient acknowledged understanding to return to the ED if noticed. Patient was stable upon discharge.       Final diagnoses:  None    ED Discharge Orders     None          Silver Wonda LABOR, [place] 03/10/24 1828    Cottie Donnice PARAS, MD 03/13/24 1058

## 2024-03-10 NOTE — ED Notes (Signed)
 PA at bedside.

## 2024-03-10 NOTE — ED Triage Notes (Signed)
 Pt reports over-exerting when she felt a snap and a pop in L knee on June 25 while in [place]. Pt was dx with possible mensicus tear. Pt also reports diarrhea since July 1. Pt reports bloody diarrhea and has hx of diverticulitis.

## 2024-03-11 ENCOUNTER — Telehealth (HOSPITAL_BASED_OUTPATIENT_CLINIC_OR_DEPARTMENT_OTHER): Payer: Self-pay | Admitting: Emergency Medicine

## 2024-03-11 LAB — GASTROINTESTINAL PANEL BY PCR, STOOL (REPLACES STOOL CULTURE)

## 2024-04-25 ENCOUNTER — Encounter (HOSPITAL_BASED_OUTPATIENT_CLINIC_OR_DEPARTMENT_OTHER): Payer: Self-pay | Admitting: Orthopedic Surgery

## 2024-04-25 ENCOUNTER — Other Ambulatory Visit: Payer: Self-pay

## 2024-05-03 ENCOUNTER — Encounter (HOSPITAL_BASED_OUTPATIENT_CLINIC_OR_DEPARTMENT_OTHER): Payer: Self-pay | Admitting: Orthopedic Surgery

## 2024-05-03 ENCOUNTER — Ambulatory Visit (HOSPITAL_BASED_OUTPATIENT_CLINIC_OR_DEPARTMENT_OTHER)
Admission: RE | Admit: 2024-05-03 | Discharge: 2024-05-03 | Disposition: A | Discharge status: Home / Self Care | Payer: Self-pay | Attending: Orthopedic Surgery | Admitting: Orthopedic Surgery

## 2024-05-03 ENCOUNTER — Other Ambulatory Visit: Payer: Self-pay

## 2024-05-03 ENCOUNTER — Ambulatory Visit (HOSPITAL_BASED_OUTPATIENT_CLINIC_OR_DEPARTMENT_OTHER): Admitting: Anesthesiology

## 2024-05-03 ENCOUNTER — Encounter (HOSPITAL_BASED_OUTPATIENT_CLINIC_OR_DEPARTMENT_OTHER)
Admission: RE | Disposition: A | Discharge status: Home / Self Care | Payer: Self-pay | Source: Home / Self Care | Attending: Orthopedic Surgery

## 2024-05-03 DIAGNOSIS — S83512A Sprain of anterior cruciate ligament of left knee, initial encounter: Secondary | ICD-10-CM | POA: Diagnosis not present

## 2024-05-03 DIAGNOSIS — S83242A Other tear of medial meniscus, current injury, left knee, initial encounter: Secondary | ICD-10-CM | POA: Diagnosis not present

## 2024-05-03 DIAGNOSIS — M65862 Other synovitis and tenosynovitis, left lower leg: Secondary | ICD-10-CM | POA: Insufficient documentation

## 2024-05-03 DIAGNOSIS — X58XXXA Exposure to other specified factors, initial encounter: Secondary | ICD-10-CM | POA: Insufficient documentation

## 2024-05-03 DIAGNOSIS — Z01818 Encounter for other preprocedural examination: Secondary | ICD-10-CM

## 2024-05-03 DIAGNOSIS — S83282A Other tear of lateral meniscus, current injury, left knee, initial encounter: Secondary | ICD-10-CM | POA: Diagnosis present

## 2024-05-03 DIAGNOSIS — F17210 Nicotine dependence, cigarettes, uncomplicated: Secondary | ICD-10-CM | POA: Diagnosis not present

## 2024-05-03 HISTORY — PX: KNEE ARTHROSCOPY WITH MEDIAL MENISECTOMY: SHX5651

## 2024-05-03 HISTORY — DX: Sprain of anterior cruciate ligament of unspecified knee, initial encounter: S83.519A

## 2024-05-03 LAB — POCT PREGNANCY, URINE: Preg Test, Ur: NEGATIVE

## 2024-05-03 SURGERY — KNEE ARTHROSCOPY WITH ANTERIOR CRUCIATE LIGAMENT (ACL) RECONSTRUCTION WITH HAMSTRING GRAFT
Anesthesia: General | Site: Knee | Laterality: Left

## 2024-05-03 MED ORDER — ONDANSETRON HCL 4 MG/2ML IJ SOLN
INTRAMUSCULAR | Status: DC | PRN
  Administered 2024-05-03: 4 mg via INTRAVENOUS

## 2024-05-03 MED ORDER — OXYCODONE HCL 5 MG PO TABS: 5.0000 mg | ORAL_TABLET | ORAL | 0 refills | Status: AC | PRN

## 2024-05-03 MED ORDER — OXYCODONE HCL 5 MG PO TABS
ORAL_TABLET | ORAL | Status: AC
  Filled 2024-05-03: qty 1

## 2024-05-03 MED ORDER — SODIUM CHLORIDE 0.9 % IR SOLN
Status: DC | PRN
  Administered 2024-05-03: 5000 mL

## 2024-05-03 MED ORDER — DROPERIDOL 2.5 MG/ML IJ SOLN: 0.6250 mg | Freq: Once | INTRAMUSCULAR | Status: DC | PRN

## 2024-05-03 MED ORDER — KETOROLAC TROMETHAMINE 30 MG/ML IJ SOLN
INTRAMUSCULAR | Status: AC
  Filled 2024-05-03: qty 1

## 2024-05-03 MED ORDER — LACTATED RINGERS IV SOLN: INTRAVENOUS | Status: DC

## 2024-05-03 MED ORDER — ONDANSETRON HCL 4 MG/2ML IJ SOLN
INTRAMUSCULAR | Status: AC
  Filled 2024-05-03: qty 2

## 2024-05-03 MED ORDER — BUPIVACAINE HCL (PF) 0.25 % IJ SOLN
INTRAMUSCULAR | Status: AC
  Filled 2024-05-03: qty 30

## 2024-05-03 MED ORDER — FENTANYL CITRATE (PF) 100 MCG/2ML IJ SOLN
INTRAMUSCULAR | Status: AC
  Filled 2024-05-03: qty 2

## 2024-05-03 MED ORDER — BUPIVACAINE-EPINEPHRINE (PF) 0.5% -1:200000 IJ SOLN
INTRAMUSCULAR | Status: DC | PRN
  Administered 2024-05-03: 30 mL via PERINEURAL

## 2024-05-03 MED ORDER — ROPIVACAINE HCL 5 MG/ML IJ SOLN
INTRAMUSCULAR | Status: DC | PRN
Start: 2024-05-03 — End: 2024-05-03
  Administered 2024-05-03: 30 mL via PERINEURAL

## 2024-05-03 MED ORDER — ONDANSETRON 4 MG PO TBDP: 4.0000 mg | ORAL_TABLET | Freq: Three times a day (TID) | ORAL | 0 refills | Status: AC | PRN

## 2024-05-03 MED ORDER — CEFAZOLIN SODIUM-DEXTROSE 2-4 GM/100ML-% IV SOLN
INTRAVENOUS | Status: AC
  Filled 2024-05-03: qty 100

## 2024-05-03 MED ORDER — ACETAMINOPHEN 500 MG PO TABS
1000.0000 mg | ORAL_TABLET | Freq: Once | ORAL | Status: AC
  Administered 2024-05-03: 1000 mg via ORAL

## 2024-05-03 MED ORDER — FENTANYL CITRATE (PF) 100 MCG/2ML IJ SOLN
100.0000 ug | Freq: Once | INTRAMUSCULAR | Status: AC
  Administered 2024-05-03: 50 ug via INTRAVENOUS

## 2024-05-03 MED ORDER — OXYCODONE HCL 5 MG PO TABS
5.0000 mg | ORAL_TABLET | Freq: Once | ORAL | Status: AC | PRN
  Administered 2024-05-03: 5 mg via ORAL

## 2024-05-03 MED ORDER — EPINEPHRINE PF 1 MG/ML IJ SOLN
INTRAMUSCULAR | Status: AC
  Filled 2024-05-03: qty 1

## 2024-05-03 MED ORDER — DEXMEDETOMIDINE HCL IN NACL 80 MCG/20ML IV SOLN
INTRAVENOUS | Status: DC | PRN
Start: 2024-05-03 — End: 2024-05-03
  Administered 2024-05-03: 4 ug via INTRAVENOUS
  Administered 2024-05-03 (×2): 8 ug via INTRAVENOUS

## 2024-05-03 MED ORDER — ACETAMINOPHEN 10 MG/ML IV SOLN: 1000.0000 mg | Freq: Once | INTRAVENOUS | Status: DC | PRN

## 2024-05-03 MED ORDER — FENTANYL CITRATE (PF) 100 MCG/2ML IJ SOLN
25.0000 ug | INTRAMUSCULAR | Status: DC | PRN
  Administered 2024-05-03 (×3): 50 ug via INTRAVENOUS

## 2024-05-03 MED ORDER — KETOROLAC TROMETHAMINE 30 MG/ML IJ SOLN
30.0000 mg | Freq: Once | INTRAMUSCULAR | Status: AC
  Administered 2024-05-03: 30 mg via INTRAVENOUS

## 2024-05-03 MED ORDER — PROPOFOL 10 MG/ML IV BOLUS
INTRAVENOUS | Status: DC | PRN
  Administered 2024-05-03: 200 mg via INTRAVENOUS

## 2024-05-03 MED ORDER — OXYCODONE HCL 5 MG/5ML PO SOLN: 5.0000 mg | Freq: Once | ORAL | Status: AC | PRN

## 2024-05-03 MED ORDER — DEXAMETHASONE SODIUM PHOSPHATE 10 MG/ML IJ SOLN
INTRAMUSCULAR | Status: DC | PRN
  Administered 2024-05-03: 10 mg via INTRAVENOUS

## 2024-05-03 MED ORDER — SCOPOLAMINE 1 MG/3DAYS TD PT72
1.0000 | MEDICATED_PATCH | TRANSDERMAL | Status: DC
  Administered 2024-05-03: 1 mg via TRANSDERMAL

## 2024-05-03 MED ORDER — BUPIVACAINE-EPINEPHRINE (PF) 0.5% -1:200000 IJ SOLN
INTRAMUSCULAR | Status: AC
  Filled 2024-05-03: qty 30

## 2024-05-03 MED ORDER — FENTANYL CITRATE (PF) 100 MCG/2ML IJ SOLN
INTRAMUSCULAR | Status: AC
Start: 2024-05-03 — End: 2024-05-03
  Filled 2024-05-03: qty 2

## 2024-05-03 MED ORDER — LIDOCAINE 2% (20 MG/ML) 5 ML SYRINGE
INTRAMUSCULAR | Status: AC
  Filled 2024-05-03: qty 5

## 2024-05-03 MED ORDER — SCOPOLAMINE 1 MG/3DAYS TD PT72
MEDICATED_PATCH | TRANSDERMAL | Status: AC
  Filled 2024-05-03: qty 1

## 2024-05-03 MED ORDER — LIDOCAINE 2% (20 MG/ML) 5 ML SYRINGE
INTRAMUSCULAR | Status: DC | PRN
  Administered 2024-05-03: 40 mg via INTRAVENOUS

## 2024-05-03 MED ORDER — DEXAMETHASONE SODIUM PHOSPHATE 10 MG/ML IJ SOLN
INTRAMUSCULAR | Status: AC
  Filled 2024-05-03: qty 1

## 2024-05-03 MED ORDER — MIDAZOLAM HCL 2 MG/2ML IJ SOLN
2.0000 mg | Freq: Once | INTRAMUSCULAR | Status: AC
  Administered 2024-05-03: 2 mg via INTRAVENOUS

## 2024-05-03 MED ORDER — MIDAZOLAM HCL 2 MG/2ML IJ SOLN
INTRAMUSCULAR | Status: AC
  Filled 2024-05-03: qty 2

## 2024-05-03 MED ORDER — CEFAZOLIN SODIUM-DEXTROSE 2-4 GM/100ML-% IV SOLN
2.0000 g | INTRAVENOUS | Status: AC
  Administered 2024-05-03: 2 g via INTRAVENOUS

## 2024-05-03 MED ORDER — ACETAMINOPHEN 500 MG PO TABS
ORAL_TABLET | ORAL | Status: AC
  Filled 2024-05-03: qty 2

## 2024-05-03 MED ORDER — FENTANYL CITRATE (PF) 100 MCG/2ML IJ SOLN
INTRAMUSCULAR | Status: DC | PRN
  Administered 2024-05-03 (×4): 50 ug via INTRAVENOUS

## 2024-05-03 MED ORDER — PROPOFOL 500 MG/50ML IV EMUL
INTRAVENOUS | Status: AC
  Filled 2024-05-03: qty 50

## 2024-05-03 MED ORDER — SODIUM CHLORIDE 0.9 % IR SOLN
Status: DC | PRN
  Administered 2024-05-03: 1 mL

## 2024-05-03 SURGICAL SUPPLY — 56 items
ANCHOR AUTOGRAFT GRAFTLINK CP2 (Anchor) IMPLANT
ANCHOR BUTTON TIGHTROPE 14 (Anchor) IMPLANT
BLADE SHAVER TORPEDO 4X13 (MISCELLANEOUS) ×1 IMPLANT
BLADE SURG 15 STRL LF DISP TIS (BLADE) ×2 IMPLANT
BNDG ELASTIC 6INX 5YD STR LF (GAUZE/BANDAGES/DRESSINGS) ×1 IMPLANT
BURR OVAL 8 FLU 4.0X13 (MISCELLANEOUS) ×1 IMPLANT
BURR OVAL 8 FLU 5.0X13 (MISCELLANEOUS) IMPLANT
CLSR STERI-STRIP ANTIMIC 1/2X4 (GAUZE/BANDAGES/DRESSINGS) ×1 IMPLANT
COOLER ICEMAN CLASSIC (MISCELLANEOUS) ×1 IMPLANT
COVER BACK TABLE 60X90IN (DRAPES) ×1 IMPLANT
CUFF TRNQT CYL 34X4.125X (TOURNIQUET CUFF) ×1 IMPLANT
CUTTER BONE 4.0MM X 13CM (MISCELLANEOUS) ×1 IMPLANT
DRAPE INCISE IOBAN 66X45 STRL (DRAPES) IMPLANT
DRAPE U-SHAPE 47X51 STRL (DRAPES) ×1 IMPLANT
DRAPE-T ARTHROSCOPY W/POUCH (DRAPES) ×1 IMPLANT
DRILL FLIPCUTTER III 6-12 (ORTHOPEDIC DISPOSABLE SUPPLIES) IMPLANT
DURAPREP 26ML APPLICATOR (WOUND CARE) ×1 IMPLANT
ELECTRODE REM PT RTRN 9FT ADLT (ELECTROSURGICAL) ×1 IMPLANT
FIBERSTICK 2 (SUTURE) IMPLANT
GAUZE PAD ABD 8X10 STRL (GAUZE/BANDAGES/DRESSINGS) ×1 IMPLANT
GAUZE SPONGE 4X4 12PLY STRL (GAUZE/BANDAGES/DRESSINGS) ×1 IMPLANT
GAUZE XEROFORM 1X8 LF (GAUZE/BANDAGES/DRESSINGS) ×1 IMPLANT
GLOVE BIO SURGEON STRL SZ7.5 (GLOVE) ×2 IMPLANT
GLOVE BIOGEL PI IND STRL 8 (GLOVE) ×2 IMPLANT
GOWN STRL REUS W/ TWL LRG LVL3 (GOWN DISPOSABLE) ×1 IMPLANT
GOWN STRL REUS W/ TWL XL LVL3 (GOWN DISPOSABLE) ×1 IMPLANT
GOWN STRL REUS W/TWL XL LVL3 (GOWN DISPOSABLE) ×2 IMPLANT
IMPL SYS 2ND FX PEEK 4.75X19.1 (Miscellaneous) IMPLANT
KNIFE GRAFT ACL 10MM 5952 (MISCELLANEOUS) IMPLANT
KNIFE GRAFT ACL 9MM (MISCELLANEOUS) IMPLANT
MANIFOLD NEPTUNE II (INSTRUMENTS) ×1 IMPLANT
PACK ARTHROSCOPY DSU (CUSTOM PROCEDURE TRAY) ×1 IMPLANT
PACK BASIN DAY SURGERY FS (CUSTOM PROCEDURE TRAY) ×1 IMPLANT
PAD COLD SHLDR WRAP-ON (PAD) ×1 IMPLANT
PENCIL SMOKE EVACUATOR (MISCELLANEOUS) IMPLANT
SHEET MEDIUM DRAPE 40X70 STRL (DRAPES) ×1 IMPLANT
SLEEVE SCD COMPRESS KNEE MED (STOCKING) ×1 IMPLANT
SPONGE T-LAP 4X18 ~~LOC~~+RFID (SPONGE) ×1 IMPLANT
SUCTION TUBE FRAZIER 10FR DISP (SUCTIONS) ×1 IMPLANT
SUT ETHILON 4 0 PS 2 18 (SUTURE) IMPLANT
SUT MNCRL AB 3-0 PS2 18 (SUTURE) ×1 IMPLANT
SUT MON AB 2-0 CT1 36 (SUTURE) ×1 IMPLANT
SUT VIC AB 0 CT1 27XBRD ANBCTR (SUTURE) IMPLANT
SUT VIC AB 1 CT1 27XBRD ANBCTR (SUTURE) IMPLANT
SUT VIC AB 2-0 CT1 TAPERPNT 27 (SUTURE) ×1 IMPLANT
SUT VICRYL 0 UR6 27IN ABS (SUTURE) IMPLANT
SUTURE FIBERWR #2 38 T-5 BLUE (SUTURE) IMPLANT
SUTURE FIBERWR#2 38 REV NDL BL (SUTURE) IMPLANT
SUTURE TAPE 1.3 FIBERLOP 20 ST (SUTURE) IMPLANT
SUTURE TAPE TIGERLINK 1.3MM BL (SUTURE) IMPLANT
TAPE LABRALWHITE 1.5X36 (TAPE) IMPLANT
TOWEL GREEN STERILE FF (TOWEL DISPOSABLE) ×2 IMPLANT
TUBE CONNECTING 20X1/4 (TUBING) IMPLANT
TUBE SUCTION HIGH CAP CLEAR NV (SUCTIONS) ×1 IMPLANT
TUBING ARTHROSCOPY IRRIG 16FT (MISCELLANEOUS) ×1 IMPLANT
WAND ABLATOR APOLLO I90 (BUR) IMPLANT

## 2024-05-03 NOTE — Anesthesia Preprocedure Evaluation (Addendum)
 Anesthesia Evaluation  Patient identified by MRN, date of birth, ID band Patient awake    Reviewed: Allergy & Precautions, NPO status , Patient's Chart, lab work & pertinent test results  Airway Mallampati: I  TM Distance: >3 FB Neck ROM: Full    Dental  (+) Teeth Intact, Dental Advisory Given   Pulmonary Current Smoker and Patient abstained from smoking.   breath sounds clear to auscultation       Cardiovascular negative cardio ROS  Rhythm:Regular Rate:Normal     Neuro/Psych negative neurological ROS  negative psych ROS   GI/Hepatic negative GI ROS, Neg liver ROS,,,  Endo/Other  negative endocrine ROS    Renal/GU negative Renal ROS     Musculoskeletal negative musculoskeletal ROS (+)    Abdominal   Peds  Hematology negative hematology ROS (+)   Anesthesia Other Findings   Reproductive/Obstetrics                              Anesthesia Physical Anesthesia Plan  ASA: 1  Anesthesia Plan: General   Post-op Pain Management: Regional block*   Induction: Intravenous  PONV Risk Score and Plan: 3 and Ondansetron , Dexamethasone  and Midazolam   Airway Management Planned: LMA  Additional Equipment: None  Intra-op Plan:   Post-operative Plan: Extubation in OR  Informed Consent: I have reviewed the patients History and Physical, chart, labs and discussed the procedure including the risks, benefits and alternatives for the proposed anesthesia with the patient or authorized representative who has indicated his/her understanding and acceptance.     Dental advisory given  Plan Discussed with: CRNA  Anesthesia Plan Comments:          Anesthesia Quick Evaluation

## 2024-05-03 NOTE — Discharge Instructions (Addendum)
 DISCHARGE INSTRUCTIONS: ________________________________________________________________________________ ACL RECONSTRUCTION HOME EXERCISE PROGRAM (0-2 WEEKS)   Elevate the leg above your heart as often as possible. Weight bear as tolerated with the Bledsoe brace, use crutches as needed, progress from 2 to 1 crutch as able using one crutch on opposite side of surgical knee.  Do not limp and do not walk too much!! You should sleep in the knee brace with it locked in full extension.  He may remove for showering.  Otherwise he may remove for exercise. Start normal showering on postoperative day #3.  Do not submerge underwater Goals for first two weeks:  minimal swelling, motion 0-90, walking with brace without crutches and positive attitude about PT. Use pain medication as needed.  You may also take Tylenol  and Advil  around-the-clock in alternating fashion in addition to the pain medication.  To prevent constipation use Colace 100mg . twice a day while on pain medication.  If constipated, use Miralax 17 gm once a day and drink plenty of fluids.  These medications can be obtained at the pharmacy without a prescription.   Follow up in the office in 14 days. You may remove your postoperative bandages on the third day from surgery and begin showering.  Do not remove the Steri-Strips.  Do not submerge underwater.  Replace your Ace bandage over your wounds before reapplying your knee brace You should also continue to wear the TED hose for 2 weeks postoperatively. You should also take an 81 mg aspirin once per day x 6 weeks for the prevention of DVT.    No Tylenol  1:15pm No ibuprofen  5:45pm   Post Anesthesia Home Care Instructions  Activity: Get plenty of rest for the remainder of the day. A responsible individual must stay with you for 24 hours following the procedure.  For the next 24 hours, DO NOT: -Drive a car -Advertising copywriter -Drink alcoholic beverages -Take any medication unless instructed  by your physician -Make any legal decisions or sign important papers.  Meals: Start with liquid foods such as gelatin or soup. Progress to regular foods as tolerated. Avoid greasy, spicy, heavy foods. If nausea and/or vomiting occur, drink only clear liquids until the nausea and/or vomiting subsides. Call your physician if vomiting continues.  Special Instructions/Symptoms: Your throat may feel dry or sore from the anesthesia or the breathing tube placed in your throat during surgery. If this causes discomfort, gargle with warm salt water. The discomfort should disappear within 24 hours.  If you had a scopolamine  patch placed behind your ear for the management of post- operative nausea and/or vomiting:  1. The medication in the patch is effective for 72 hours, after which it should be removed.  Wrap patch in a tissue and discard in the trash. Wash hands thoroughly with soap and water. 2. You may remove the patch earlier than 72 hours if you experience unpleasant side effects which may include dry mouth, dizziness or visual disturbances. 3. Avoid touching the patch. Wash your hands with soap and water after contact with the patch.     Regional Anesthesia Blocks  1. You may not be able to move or feel the blocked extremity after a regional anesthetic block. This may last may last from 3-48 hours after placement, but it will go away. The length of time depends on the medication injected and your individual response to the medication. As the nerves start to wake up, you may experience tingling as the movement and feeling returns to your extremity. If the numbness and inability  to move your extremity has not gone away after 48 hours, please call your surgeon.   2. The extremity that is blocked will need to be protected until the numbness is gone and the strength has returned. Because you cannot feel it, you will need to take extra care to avoid injury. Because it may be weak, you may have difficulty  moving it or using it. You may not know what position it is in without looking at it while the block is in effect.  3. For blocks in the legs and feet, returning to weight bearing and walking needs to be done carefully. You will need to wait until the numbness is entirely gone and the strength has returned. You should be able to move your leg and foot normally before you try and bear weight or walk. You will need someone to be with you when you first try to ensure you do not fall and possibly risk injury.  4. Bruising and tenderness at the needle site are common side effects and will resolve in a few days.  5. Persistent numbness or new problems with movement should be communicated to the surgeon or the [facility] [phone] [facility] [phone]).

## 2024-05-03 NOTE — Op Note (Signed)
 Surgery Date: 05/03/2024    Surgeon(s): Sharl Caroline Dover, MD   ASSIST:  Dayle Moores, PA-C  Assistant attestation:  PA Mcclung scrubbed and present for the entire procedure.   Implants:  Arthrex all inside cortical buttons on femur and tibia 4.75 PEEK swivel lock x 1.   ANESTHESIA:  general, and adductor block   IV FLUIDS AND URINE: See anesthesia.   TOURNIQUET: 50 minutes at 265 mmHg   DRAINS: none   COMPLICATIONS: None.     ESTIMATED BLOOD LOSS: minimal   PREOPERATIVE DIAGNOSES:  1.  Left knee medial meniscus tear 2.  Left knee complete ACL rupture   POSTOPERATIVE DIAGNOSES:  1.  Left knee medial meniscus tear 2.  Left knee complete ACL rupture 3.  Left knee vertical white zone tear lateral meniscus   PROCEDURES PERFORMED:  1.  Left knee arthroscopy with Hamstring autograft ACL reconstruction 2.  Left knee arthroscopic partial lateral and medial meniscectomies    DESCRIPTION OF PROCEDURE:  Caroline Walker is a 38 year old female with left knee Complete ACL rupture, partial MCL tear, medial meniscus tear.  They sustained these injuries about 2 months prior to coming to the operating room today.  After a short period of prehabilitation to allow for return of ROM and quadriceps strenght, we discussed proceeding with arthroscopically assisted hamstring autograft ACL reconstruction and medial and lateral meniscectomy versus repair.  We reviewed the risks benefits and indications of this procedure including but not limited to bleeding, infection, damage to neurovascular structures, need for future surgery, developed an of arthrosis, rupture of graft, continued instability of the knee, and developement of blood clots and risk of anesthesia.  All questions answered.   The patient was identified in the preoperative holding area and the operative extremity was marked. The patient was brought to the operating room and transferred to operating table in a supine position.  Satisfactory general anesthesia was induced by anesthesiology.     Examination under anesthesia revealed a grade 2B Lachman, grade 2 pivot shift, and stable to varus and valgus stress.    The procedure was initiated by obtaining a hamstring graft. An Esmarch was used to wrap out the leg and the tourniquet was raised for a short time. A 3-inch incision was created over the pes anserine. Dissection was carried out to the level of the sartorius, which was divided above the gracilis tendon, then everted to reveal the semitendinosus tendon which was released distally, tagged and stripped per usual.   The sartorius and gracilis were then repaired back to their insertion with heavy Vicryl suture, that was later incorporated into the swivel lock anchor.   At the back table, we next prepared the graft by removing muscle and tenosynovium. The semitendinosis graft were was cut to length and quadrupled, looping around the Arthrex ACL TightRope for the femoral side and ABS tightrope for the tibial side. The two free ends of the autograft were secured to each other at the tibial side with #2 interlocking FiberWire sutures.  The remainder of the graft was then circumferentially secured according to the manufacturer's recommendations with a 0 FiberWire suture twice at the tibial side and once at the femoral side. The graft was pretensioned on the back table and wrapped in a saline-soaked gauze.  The graft measured 66 mm in total length, 7.5 mm on femoral tunnel diameter, and 8.0 mm diameter on the tibial tunnel.   Standard anterolateral, anteromedial arthroscopy portals were obtained. The anteromedial portal was obtained with  a spinal needle for localization under direct visualization with subsequent diagnostic findings.    Anteromedial and anterolateral chambers: mild synovitis. The synovitis was debrided with a 4.5 mm full radius shaver through both the anteromedial and lateral portals.    Suprapatellar pouch and  gutters: no synovitis or debris. Patella chondral surface: Grade 0 Trochlear chondral surface: Grade 0 Patellofemoral tracking: Midline, no tilt Medial meniscus: Horizontal tearing of the posterior horn and mid body with flipped fragment in the medial gutter Medial femoral condyle flexion bearing surface: Grade 0 Medial femoral condyle extension bearing surface: Grade 0 Medial tibial plateau: Grade 0 Anterior cruciate ligament:Complete mid substance tear Posterior cruciate ligament:stable Lateral meniscus: White zone posterior horn tear.  This was a vertical tear that was complete but in the white zone.Lateral femoral condyle flexion bearing surface: Grade 0 Lateral femoral condyle extension bearing surface: Grade 0 Lateral tibial plateau: Grade 1   Next, We turned our attention to the medial meniscus tear.  This was treated with arthroscopic partial medial meniscectomy with motorized shaver.  The undersurface flap tear was resected back to a stable border.   The lateral meniscus was inspected closely and found to have a white zone vertical tear.  This was probed and found to be unstable, but in an avascular region.  Therefore elected to be treated with partial meniscectomy.  Combination of meniscal biter as well as motorized shaver were used to resect back to stable border on the lateral side.    Next, the ACL reconstruction was undertaken. The ACL stump was removed with thermal ablation and shaver and anatomic bony landmarks were marked for the placement of the femoral and tibial sockets.  Arthrex retroguides and Flipcutters were used to create the sockets and perform the procedure by an all-inside GraftLink technique.  The femoral socket was created at the inferior portion of the bifurcate ridge of the lateral femoral wall with a size 7.5 mm FlipCutter to a depth of  15 mm while the tibial socket was created at the center of the ACL footprint from front to back and toward the base of the medial  tibial eminence from medial to lateral, to a depth of 23-25 mm with a 8.0 mm FlipCutter. Bony debris was removed and the edges of socket apertures were smoothed. Suture shuttles were used to deliver the graft into the femoral socket first and the tibial socket second. The graft was then secured within the sockets, cinching the self-locking sutures overtop of the proximal and distal cortical buttons with the knee in a reduced position maintained at 20 degrees flexion while a moderate force posterior drawer was applied.  After this preliminary tensioning, the knee was placed through several flexion-extension cycles to eliminate any graft settling or excursion and the graft was re-tensioned in the same manner and the sutures were tied over top of the buttons proximally and distally, and the four tibial sided suture arms were secondarily secured at the proximal tibia with 1 SwiveLock anchor. Of note we did also pass a free labral tape through the ACL fixation as an separate internal brace backup fixation.   Final images of the ACL graft were obtained, revealing no lateral wall or roof impingement of the graft at the notch through range of motion.  Stability of the ACL graft was assessed and found to be normalized at grade 0 lachman and grade 0 pivot shift.    The wounds were all closed in layers per usual.  Dressings were applied and a brace  placed And locked in 0 of flexion..  There were no apparent complications.  The patient was awakened and taken to recovery room in satisfactory condition.   POSTOPERATIVE PLAN:  Caroline Walker will be touch down weight bearing on crutches until cleared by The therapist.  They will likewise be in The knee brace with it locked until quad function is normalized. They will be on 81 mg asa daily for 6 weeks for DVT PPX.  they will return to the clinic to see the surgeon in 2 weeks.   Caroline Belvie Gosling, MD Orthopedic surgeon

## 2024-05-03 NOTE — Progress Notes (Signed)
Assisted Dr. Hollis with left, adductor canal, ultrasound guided block. Side rails up, monitors on throughout procedure. See vital signs in flow sheet. Tolerated Procedure well.  

## 2024-05-03 NOTE — Anesthesia Procedure Notes (Signed)
 Procedure Name: LMA Insertion Date/Time: 05/03/2024 7:46 AM  Performed by: Julieanne Fairy BROCKS, CRNAPre-anesthesia Checklist: Patient identified, Emergency Drugs available, Suction available and Patient being monitored Patient Re-evaluated:Patient Re-evaluated prior to induction Oxygen Delivery Method: Circle system utilized Preoxygenation: Pre-oxygenation with 100% oxygen Induction Type: IV induction Ventilation: Mask ventilation without difficulty LMA: LMA inserted LMA Size: 4.0 Number of attempts: 1 Airway Equipment and Method: Bite block Placement Confirmation: positive ETCO2 Tube secured with: Tape Dental Injury: Teeth and Oropharynx as per pre-operative assessment

## 2024-05-03 NOTE — Brief Op Note (Signed)
 05/03/2024  8:59 AM  PATIENT:  Caroline Walker  38 y.o. female  PRE-OPERATIVE DIAGNOSIS:  Left knee anterior cruciate ligament tear, medial meniscus tear  POST-OPERATIVE DIAGNOSIS:  Left knee anterior cruciate ligament tear, medial meniscus tear, lateral meniscus tear  PROCEDURE:  Procedure(s) with comments: KNEE ARTHROSCOPY WITH ANTERIOR CRUCIATE LIGAMENT (ACL) RECONSTRUCTION WITH HAMSTRING GRAFT (Left) - Left knee arthroscopy with anterior cruciate liagment reconstruction with hamstring autograft and partial medial menisectomy ARTHROSCOPY, KNEE, WITH MEDIAL AND PARTIAL LATERAL MENISCECTOMY (Left)  SURGEON:  Surgeons and Role:    * Sharl Selinda Dover, MD - Primary  PHYSICIAN ASSISTANT: Janace Moores, PA-C  ANESTHESIA:   regional and general  EBL:   10 cc  BLOOD ADMINISTERED:none  DRAINS: none   LOCAL MEDICATIONS USED:  NONE  SPECIMEN:  No Specimen  DISPOSITION OF SPECIMEN:  N/A  COUNTS:  YES  TOURNIQUET:  * Missing tourniquet times found for documented tourniquets in log: [id number] *  DICTATION: .Note written in EPIC  PLAN OF CARE: Discharge to home after PACU  PATIENT DISPOSITION:  PACU - hemodynamically stable.   Delay start of Pharmacological VTE agent (>24hrs) due to surgical blood loss or risk of bleeding: not applicable

## 2024-05-03 NOTE — H&P (Signed)
 ORTHOPAEDIC H and P  REQUESTING PHYSICIAN: Sharl Selinda Dover, MD  PCP:  Pcp, No  Chief Complaint: Left knee instability  HPI: Caroline Walker is a 38 y.o. female who complains of left knee pain and instability.  She has a complete ACL tear and medial meniscus tear.  Here today for surgical management.  No new complaints.  Past Medical History:  Diagnosis Date   Torn ACL    Past Surgical History:  Procedure Laterality Date   NO PAST SURGERIES     Social History   Socioeconomic History   Marital status: Married    Spouse name: Not on file   Number of children: Not on file   Years of education: Not on file   Highest education level: Not on file  Occupational History   Not on file  Tobacco Use   Smoking status: Some Days    Types: Cigarettes   Smokeless tobacco: Never  Vaping Use   Vaping status: Every Day  Substance and Sexual Activity   Alcohol use: Yes    Comment: Once Weekly.   Drug use: Yes    Types: Marijuana, Cocaine    Comment: Cocaine 1/Week   Sexual activity: Not on file  Other Topics Concern   Not on file  Social History Narrative   Not on file   Social Drivers of Health   Financial Resource Strain: Not on file  Food Insecurity: Not on file  Transportation Needs: Not on file  Physical Activity: Not on file  Stress: Not on file  Social Connections: Unknown (05/01/2022)   Received from [facility]   Social Network    Social Network: Not on file   History reviewed. No pertinent family history. Allergies  Allergen Reactions   Fish Allergy Anaphylaxis   Bee Venom Hives   Penicillins Hives   Prior to Admission medications   Medication Sig Start Date End Date Taking? Authorizing Provider  EPINEPHrine  0.3 mg/0.3 mL IJ SOAJ injection Self inject per package instructions as needed for allergic reaction. 09/12/21   Molpus, John, MD  EPINEPHrine  0.3 mg/0.3 mL IJ SOAJ injection Inject 0.3 mg into the muscle as needed for anaphylaxis. 09/12/21   Elnor Hila P, DO  ondansetron  (ZOFRAN -ODT) 4 MG disintegrating tablet Take 1 tablet (4 mg total) by mouth every 8 (eight) hours as needed. 03/10/24   Silver Fell A, PA   No results found.  Positive ROS: All other systems have been reviewed and were otherwise negative with the exception of those mentioned in the HPI and as above.  Physical Exam: General: Alert, no acute distress Cardiovascular: No pedal edema Respiratory: No cyanosis, no use of accessory musculature GI: No organomegaly, abdomen is soft and non-tender Skin: No lesions in the area of chief complaint Neurologic: Sensation intact distally Psychiatric: Patient is competent for consent with normal mood and affect Lymphatic: No axillary or cervical lymphadenopathy  MUSCULOSKELETAL: LLE- wwp nvi  Assessment: Left anterior cruciate ligament tear Left medial mensicus tear  Plan: - OR today for arthrosocpy and ACL recon with hamstring autograft.  Partial medial meniscectomy.     = dc home post op in brace  - The risks, benefits, and alternatives were discussed with the patient. There are risks associated with the surgery including, but not limited to, problems with anesthesia (death), infection, differences in leg length/angulation/rotation, fracture of bones, loosening or failure of implants, malunion, nonunion, hematoma (blood accumulation) which may require surgical drainage, blood clots, pulmonary embolism, nerve injury (foot  drop), and blood vessel injury. The patient understands these risks and elects to proceed.     Selinda Belvie Gosling, MD Cell [phone]    05/03/2024 6:41 AM

## 2024-05-03 NOTE — Transfer of Care (Signed)
 Immediate Anesthesia Transfer of Care Note  Patient: Caroline Walker  Procedure(s) Performed: KNEE ARTHROSCOPY WITH ANTERIOR CRUCIATE LIGAMENT (ACL) RECONSTRUCTION WITH HAMSTRING GRAFT (Left: Knee) ARTHROSCOPY, KNEE, WITH MEDIAL MENISCECTOMY (Left: Knee)  Patient Location: PACU  Anesthesia Type:GA combined with regional for post-op pain  Level of Consciousness: awake, alert , and oriented  Airway & Oxygen Therapy: Patient Spontanous Breathing and Patient connected to face mask oxygen  Post-op Assessment: Report given to RN and Post -op Vital signs reviewed and stable  Post vital signs: Reviewed and stable  Last Vitals:  Vitals Value Taken Time  BP 107/66 05/03/24 09:15  Temp    Pulse 99 05/03/24 09:19  Resp 21 05/03/24 09:19  SpO2 95 % 05/03/24 09:19  Vitals shown include unfiled device data.  Last Pain:  Vitals:   05/03/24 0642  PainSc: 9          Complications: No notable events documented.

## 2024-05-03 NOTE — Anesthesia Postprocedure Evaluation (Signed)
 Anesthesia Post Note  Patient: Caroline Walker  Procedure(s) Performed: KNEE ARTHROSCOPY WITH ANTERIOR CRUCIATE LIGAMENT (ACL) RECONSTRUCTION WITH HAMSTRING GRAFT (Left: Knee) ARTHROSCOPY, KNEE, WITH MEDIAL MENISCECTOMY (Left: Knee)     Patient location during evaluation: PACU Anesthesia Type: General Level of consciousness: awake and alert Pain management: pain level controlled Vital Signs Assessment: post-procedure vital signs reviewed and stable Respiratory status: spontaneous breathing, nonlabored ventilation, respiratory function stable and patient connected to nasal cannula oxygen Cardiovascular status: blood pressure returned to baseline and stable Postop Assessment: no apparent nausea or vomiting Anesthetic complications: no   No notable events documented.  Last Vitals:  Vitals:   05/03/24 1015 05/03/24 1031  BP: 123/84 118/79  Pulse: 69 68  Resp: 14 18  Temp:  36.6 C  SpO2: 99% 96%    Last Pain:  Vitals:   05/03/24 1031  TempSrc: Tympanic  PainSc:                  Caroline Walker

## 2024-05-03 NOTE — Anesthesia Procedure Notes (Signed)
 Anesthesia Regional Block: Adductor canal block   Pre-Anesthetic Checklist: , timeout performed,  Correct Patient, Correct Site, Correct Laterality,  Correct Procedure, Correct Position, site marked,  Risks and benefits discussed,  Surgical consent,  Pre-op evaluation,  At surgeon's request and post-op pain management  Laterality: Left  Prep: chloraprep       Needles:  Injection technique: Single-shot  Needle Type: Echogenic Stimulator Needle     Needle Length: 9cm  Needle Gauge: 21     Additional Needles:   Procedures:,,,, ultrasound used (permanent image in chart),,    Narrative:  Start time: 05/03/2024 7:05 AM End time: 05/03/2024 7:10 AM Injection made incrementally with aspirations every 5 mL.  Performed by: Personally  Anesthesiologist: Tilford Franky BIRCH, MD  Additional Notes: Discussed risks and benefits of the nerve block in detail, including but not limited vascular injury, permanent nerve damage and infection.   Patient tolerated the procedure well. Local anesthetic introduced in an incremental fashion under minimal resistance after negative aspirations. No paresthesias were elicited. After completion of the procedure, no acute issues were identified and patient continued to be monitored by RN.

## 2024-05-07 ENCOUNTER — Encounter (HOSPITAL_BASED_OUTPATIENT_CLINIC_OR_DEPARTMENT_OTHER): Payer: Self-pay | Admitting: Orthopedic Surgery
